# Patient Record
Sex: Male | Born: 1960 | Race: White | Hispanic: No | Marital: Married | Smoking: Never smoker
Health system: Southern US, Community
[De-identification: ages and names within clinical notes are randomized; demographics above are authoritative.]

## PROBLEM LIST (undated history)

## (undated) DIAGNOSIS — G4730 Sleep apnea, unspecified: Secondary | ICD-10-CM

## (undated) DIAGNOSIS — N189 Chronic kidney disease, unspecified: Secondary | ICD-10-CM

## (undated) DIAGNOSIS — R0600 Dyspnea, unspecified: Secondary | ICD-10-CM

---

## 2018-11-03 ENCOUNTER — Inpatient Hospital Stay (HOSPITAL_COMMUNITY)
Admission: EM | Admit: 2018-11-03 | Discharge: 2018-11-09 | DRG: 673 | Disposition: A | Discharge status: Home / Self Care | Payer: No Typology Code available for payment source | Attending: Internal Medicine | Admitting: Internal Medicine

## 2018-11-03 ENCOUNTER — Emergency Department (HOSPITAL_COMMUNITY): Payer: No Typology Code available for payment source

## 2018-11-03 ENCOUNTER — Encounter (HOSPITAL_COMMUNITY): Payer: Self-pay | Admitting: Emergency Medicine

## 2018-11-03 ENCOUNTER — Other Ambulatory Visit: Payer: Self-pay

## 2018-11-03 DIAGNOSIS — N2581 Secondary hyperparathyroidism of renal origin: Secondary | ICD-10-CM | POA: Diagnosis present

## 2018-11-03 DIAGNOSIS — Z419 Encounter for procedure for purposes other than remedying health state, unspecified: Secondary | ICD-10-CM

## 2018-11-03 DIAGNOSIS — N186 End stage renal disease: Secondary | ICD-10-CM | POA: Diagnosis present

## 2018-11-03 DIAGNOSIS — G8250 Quadriplegia, unspecified: Secondary | ICD-10-CM | POA: Diagnosis present

## 2018-11-03 DIAGNOSIS — Z7682 Awaiting organ transplant status: Secondary | ICD-10-CM | POA: Diagnosis not present

## 2018-11-03 DIAGNOSIS — Z20828 Contact with and (suspected) exposure to other viral communicable diseases: Secondary | ICD-10-CM | POA: Diagnosis present

## 2018-11-03 DIAGNOSIS — N319 Neuromuscular dysfunction of bladder, unspecified: Secondary | ICD-10-CM | POA: Diagnosis present

## 2018-11-03 DIAGNOSIS — E785 Hyperlipidemia, unspecified: Secondary | ICD-10-CM | POA: Diagnosis present

## 2018-11-03 DIAGNOSIS — G4730 Sleep apnea, unspecified: Secondary | ICD-10-CM | POA: Diagnosis present

## 2018-11-03 DIAGNOSIS — Z992 Dependence on renal dialysis: Secondary | ICD-10-CM | POA: Diagnosis not present

## 2018-11-03 DIAGNOSIS — D509 Iron deficiency anemia, unspecified: Secondary | ICD-10-CM | POA: Diagnosis present

## 2018-11-03 DIAGNOSIS — E872 Acidosis: Secondary | ICD-10-CM | POA: Diagnosis present

## 2018-11-03 DIAGNOSIS — I120 Hypertensive chronic kidney disease with stage 5 chronic kidney disease or end stage renal disease: Secondary | ICD-10-CM | POA: Diagnosis present

## 2018-11-03 DIAGNOSIS — D649 Anemia, unspecified: Secondary | ICD-10-CM | POA: Diagnosis not present

## 2018-11-03 DIAGNOSIS — N179 Acute kidney failure, unspecified: Secondary | ICD-10-CM | POA: Diagnosis present

## 2018-11-03 DIAGNOSIS — E878 Other disorders of electrolyte and fluid balance, not elsewhere classified: Secondary | ICD-10-CM | POA: Diagnosis not present

## 2018-11-03 DIAGNOSIS — Z79899 Other long term (current) drug therapy: Secondary | ICD-10-CM | POA: Diagnosis not present

## 2018-11-03 HISTORY — DX: Chronic kidney disease, unspecified: N18.9

## 2018-11-03 HISTORY — DX: Dyspnea, unspecified: R06.00

## 2018-11-03 HISTORY — DX: Sleep apnea, unspecified: G47.30

## 2018-11-03 LAB — CBC
HCT: 26.8 % — ABNORMAL LOW (ref 39.0–52.0)
Hemoglobin: 9 g/dL — ABNORMAL LOW (ref 13.0–17.0)
MCH: 32.4 pg (ref 26.0–34.0)
MCHC: 33.6 g/dL (ref 30.0–36.0)
MCV: 96.4 fL (ref 80.0–100.0)
Platelets: 198 10*3/uL (ref 150–400)
RBC: 2.78 MIL/uL — ABNORMAL LOW (ref 4.22–5.81)
RDW: 13.5 % (ref 11.5–15.5)
WBC: 8.7 10*3/uL (ref 4.0–10.5)
nRBC: 0 % (ref 0.0–0.2)

## 2018-11-03 LAB — IRON AND TIBC
Iron: 56 ug/dL (ref 45–182)
Saturation Ratios: 14 % — ABNORMAL LOW (ref 17.9–39.5)
TIBC: 403 ug/dL (ref 250–450)
UIBC: 347 ug/dL

## 2018-11-03 LAB — BASIC METABOLIC PANEL
Anion gap: 18 — ABNORMAL HIGH (ref 5–15)
BUN: 128 mg/dL — ABNORMAL HIGH (ref 6–20)
CO2: 14 mmol/L — ABNORMAL LOW (ref 22–32)
Calcium: 9.1 mg/dL (ref 8.9–10.3)
Chloride: 108 mmol/L (ref 98–111)
Creatinine, Ser: 8.44 mg/dL — ABNORMAL HIGH (ref 0.61–1.24)
GFR calc Af Amer: 7 mL/min — ABNORMAL LOW (ref >60)
GFR calc non Af Amer: 6 mL/min — ABNORMAL LOW (ref >60)
Glucose, Bld: 110 mg/dL — ABNORMAL HIGH (ref 70–99)
Potassium: 4.6 mmol/L (ref 3.5–5.1)
Sodium: 140 mmol/L (ref 135–145)

## 2018-11-03 LAB — SARS CORONAVIRUS 2 (TAT 6-24 HRS): SARS Coronavirus 2: NEGATIVE

## 2018-11-03 MED ORDER — SODIUM CHLORIDE 0.9% FLUSH
3.0000 mL | Freq: Once | INTRAVENOUS | Status: AC
  Administered 2018-11-04: 3 mL via INTRAVENOUS

## 2018-11-03 MED ORDER — DIAZEPAM 2 MG PO TABS: 2.0000 mg | ORAL_TABLET | Freq: Every evening | ORAL | Status: DC | PRN

## 2018-11-03 MED ORDER — TRAZODONE HCL 100 MG PO TABS
100.0000 mg | ORAL_TABLET | Freq: Every day | ORAL | Status: DC
  Administered 2018-11-03 – 2018-11-08 (×6): 100 mg via ORAL
  Filled 2018-11-03 (×6): qty 1

## 2018-11-03 MED ORDER — ACETAMINOPHEN 650 MG RE SUPP: 650.0000 mg | Freq: Four times a day (QID) | RECTAL | Status: DC | PRN

## 2018-11-03 MED ORDER — AMLODIPINE BESYLATE 10 MG PO TABS
10.0000 mg | ORAL_TABLET | Freq: Every day | ORAL | Status: DC
  Administered 2018-11-04 – 2018-11-08 (×4): 10 mg via ORAL
  Filled 2018-11-03 (×5): qty 1

## 2018-11-03 MED ORDER — ONDANSETRON HCL 4 MG PO TABS: 4.0000 mg | ORAL_TABLET | Freq: Four times a day (QID) | ORAL | Status: DC | PRN

## 2018-11-03 MED ORDER — ONDANSETRON HCL 4 MG/2ML IJ SOLN
4.0000 mg | Freq: Four times a day (QID) | INTRAMUSCULAR | Status: DC | PRN
  Administered 2018-11-04: 4 mg via INTRAVENOUS
  Filled 2018-11-03: qty 2

## 2018-11-03 MED ORDER — ACETAMINOPHEN 325 MG PO TABS
650.0000 mg | ORAL_TABLET | Freq: Four times a day (QID) | ORAL | Status: DC | PRN
  Administered 2018-11-05: 650 mg via ORAL
  Filled 2018-11-03: qty 2

## 2018-11-03 MED ORDER — ATORVASTATIN CALCIUM 10 MG PO TABS
10.0000 mg | ORAL_TABLET | Freq: Every day | ORAL | Status: DC
  Administered 2018-11-03 – 2018-11-09 (×7): 10 mg via ORAL
  Filled 2018-11-03 (×7): qty 1

## 2018-11-03 MED ORDER — VITAMIN D 25 MCG (1000 UNIT) PO TABS
1000.0000 [IU] | ORAL_TABLET | Freq: Every day | ORAL | Status: DC
  Administered 2018-11-04 – 2018-11-09 (×6): 1000 [IU] via ORAL
  Filled 2018-11-03 (×6): qty 1

## 2018-11-03 MED ORDER — SODIUM BICARBONATE 650 MG PO TABS
1300.0000 mg | ORAL_TABLET | Freq: Three times a day (TID) | ORAL | Status: DC
  Administered 2018-11-03 – 2018-11-04 (×2): 1300 mg via ORAL
  Filled 2018-11-03 (×2): qty 2

## 2018-11-03 MED ORDER — HEPARIN SODIUM (PORCINE) 5000 UNIT/ML IJ SOLN
5000.0000 [IU] | Freq: Three times a day (TID) | INTRAMUSCULAR | Status: DC
  Administered 2018-11-03 – 2018-11-09 (×16): 5000 [IU] via SUBCUTANEOUS
  Filled 2018-11-03 (×17): qty 1

## 2018-11-03 MED ORDER — SEVELAMER CARBONATE 800 MG PO TABS
800.0000 mg | ORAL_TABLET | Freq: Three times a day (TID) | ORAL | Status: DC
  Administered 2018-11-04 (×2): 800 mg via ORAL
  Filled 2018-11-03 (×2): qty 1

## 2018-11-03 NOTE — ED Notes (Addendum)
ED TO INPATIENT HANDOFF REPORT  ED Nurse Name and Phone #: William Hamburger, RN [phone]  S Name/Age/Gender Jeremiah Page 58 y.o. male Room/Bed: 041C/041C  Code Status   Code Status: Not on file  Home/SNF/Other Home Patient oriented to: situation Is this baseline? No   Triage Complete: Triage complete  Chief Complaint sick  Triage Note Pt arrives from [place] clinic where he was sent here to "get started with PD" pt had his access placed recently but has not started PD as of yet . Pot is also of the transplant list for a kidney due to kidney failure.    Allergies No Known Allergies  Level of Care/Admitting Diagnosis ED Disposition    ED Disposition Condition Comment   Admit  The patient appears reasonably stabilized for admission considering the current resources, flow, and capabilities available in the ED at this time, and I doubt any other [facility] requiring further screening and/or treatment in the ED prior to admission is  present.       B Medical/Surgery History History reviewed. No pertinent past medical history. History reviewed. No pertinent surgical history.   A IV Location/Drains/Wounds Patient Lines/Drains/Airways Status   Active Line/Drains/Airways    Name:   Placement date:   Placement time:   Site:   Days:   Peripheral IV 11/03/18 Right Antecubital   11/03/18    1600    Antecubital   less than 1          Intake/Output Last 24 hours  Intake/Output Summary (Last 24 hours) at 11/03/2018 1709 Last data filed at 11/03/2018 1542 Gross per 24 hour  Intake 0 ml  Output 0 ml  Net 0 ml    Labs/Imaging Results for orders placed or performed during the hospital encounter of 11/03/18 (from the past 48 hour(s))  Basic metabolic panel     Status: Abnormal   Collection Time: 11/03/18 10:55 AM  Result Value Ref Range   Sodium 140 135 - 145 mmol/L   Potassium 4.6 3.5 - 5.1 mmol/L   Chloride 108 98 - 111 mmol/L   CO2 14 (L) 22 - 32 mmol/L   Glucose, Bld 110 (H) 70 - 99  mg/dL   BUN 128 (H) 6 - 20 mg/dL   Creatinine, Ser 8.44 (H) 0.61 - 1.24 mg/dL   Calcium 9.1 8.9 - 10.3 mg/dL   GFR calc non Af Amer 6 (L) >60 mL/min   GFR calc Af Amer 7 (L) >60 mL/min   Anion gap 18 (H) 5 - 15    Comment: Performed at [facility], [address] [place], [place] [address]  CBC     Status: Abnormal   Collection Time: 11/03/18 10:55 AM  Result Value Ref Range   WBC 8.7 4.0 - 10.5 K/uL   RBC 2.78 (L) 4.22 - 5.81 MIL/uL   Hemoglobin 9.0 (L) 13.0 - 17.0 g/dL   HCT 26.8 (L) 39.0 - 52.0 %   MCV 96.4 80.0 - 100.0 fL   MCH 32.4 26.0 - 34.0 pg   MCHC 33.6 30.0 - 36.0 g/dL   RDW 13.5 11.5 - 15.5 %   Platelets 198 150 - 400 K/uL   nRBC 0.0 0.0 - 0.2 %    Comment: Performed at [facility], [place] [address], [place], [place] [address]   No results found.  Pending Labs Unresulted Labs (From admission, onward)    Start     Ordered   11/03/18 1704  Parathyroid hormone, intact (  no Ca)  Once,   STAT     11/03/18 1703   11/03/18 1704  Iron and TIBC  Once,   STAT     11/03/18 1703   11/03/18 1550  SARS CORONAVIRUS 2 (TAT 6-12 HRS) Nasal Swab Aptima Multi Swab  (COVID Labs)  Once,   STAT    Question Answer Comment  Is this test for diagnosis or screening Screening   Symptomatic for COVID-19 as defined by CDC No   Hospitalized for COVID-19 No   Admitted to ICU for COVID-19 No   Previously tested for COVID-19 No   Resident in a congregate (group) care setting No   Employed in healthcare setting No   Pre-procedural testing Yes      11/03/18 1549          Vitals/Pain Today's Vitals   11/03/18 1539 11/03/18 1542 11/03/18 1600 11/03/18 1630  BP: (!) (P) 136/94 (!) 136/94 (!) 140/100 (!) 146/97  Pulse: (P) 75 71 68 73  Resp:  18 14 16   Temp:      TempSrc:      SpO2:  100% 100% 100%  Weight:      Height:      PainSc:        Isolation Precautions No active isolations  Medications Medications  sodium chloride flush (NS) 0.9 % injection 3 mL (has no  administration in time range)    Mobility manual wheelchair Moderate fall risk   Focused Assessments Cardiac Assessment Handoff:  Cardiac Rhythm: Normal sinus rhythm No results found for: CKTOTAL, CKMB, CKMBINDEX, TROPONINI No results found for: DDIMER Does the Patient currently have chest pain? No   , Renal Assessment Handoff:  Hemodialysis Schedule: none yet.  Last Hemodialysis date and time: New** peritoneal dialysis.    Restricted appendage:  none; see above.   , Pulmonary Assessment Handoff:  Lung sounds:   O2 Device: Room Air        R Recommendations: See Admitting Provider Note  Report given to:   Additional Notes:
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
## 2018-11-03 NOTE — ED Triage Notes (Signed)
Pt arrives from [place] clinic where he was sent here to "get started with PD" pt had his access placed recently but has not started PD as of yet . Pot is also of the transplant list for a kidney due to kidney failure.

## 2018-11-03 NOTE — ED Provider Notes (Signed)
[place] [facility] EMERGENCY DEPARTMENT Provider Note   CSN: [id number] Arrival date & time: 11/03/18  1036     History   Chief Complaint Chief Complaint  Patient presents with  . Vascular Access Problem    HPI Jeremiah Page is a 58 y.o. male with history of CKD currently awaiting Page, [facility] Page for his CKD on September 3 of this year.  Patient reports that his symptoms of fatigue and leg twitching have been worsening, he spoke to his nephrologist at the [facility] who in turn spoke to nephrologist Dr. Joelyn Oms, plan was to bring patient to [facility] to begin peritoneal Page.  Patient denies fever/chills, headache, chest pain, abdominal pain, nausea/vomiting or any additional concerns today.  Unable to access records through EMR.    HPI  History reviewed. No pertinent past medical history.  There are no active problems to display for this patient.   History reviewed. No pertinent surgical history.      Home Medications    Prior to Admission medications   Not on File    Family History No family history on file.  Social History Social History   Tobacco Use  . Smoking status: Not on file  Substance Use Topics  . Alcohol use: Not on file  . Drug use: Not on file     Allergies   Patient has no known allergies.   Review of Systems Review of Systems Ten systems are reviewed and are negative for acute change except as noted in the HPI  Physical Exam Updated Vital Signs BP (!) 136/94 (BP Location: Right Arm)   Pulse 71   Temp (!) 97.5 F (36.4 C) (Oral)   Resp 18   Ht 5\' 11"  (1.803 m)   Wt 83.9 kg   SpO2 100%   BMI 25.80 kg/m   Physical Exam Constitutional:      General: He is not in acute distress.    Appearance: Normal appearance. He is well-developed. He is not ill-appearing or diaphoretic.  HENT:     Head:  Normocephalic and atraumatic.     Right Ear: External ear normal.     Left Ear: External ear normal.     Nose: Nose normal.  Eyes:     General: Vision grossly intact. Gaze aligned appropriately.     Pupils: Pupils are equal, round, and reactive to light.  Neck:     Musculoskeletal: Normal range of motion.     Trachea: Trachea and phonation normal. No tracheal deviation.  Pulmonary:     Effort: Pulmonary effort is normal. No respiratory distress.  Abdominal:     General: There is no distension.     Palpations: Abdomen is soft.     Tenderness: There is no abdominal tenderness. There is no guarding or rebound.  Musculoskeletal: Normal range of motion.  Skin:    General: Skin is warm and dry.  Neurological:     Mental Status: He is alert.     GCS: GCS eye subscore is 4. GCS verbal subscore is 5. GCS motor subscore is 6.     Comments: Speech is clear and goal oriented, follows commands Major Cranial nerves without deficit, no facial droop Moves extremities without ataxia, coordination intact Occasional twitch of right lower extremity.  Psychiatric:        Behavior: Behavior normal.    ED Treatments / Results  Labs (all labs  ordered are listed, but only abnormal results are displayed) Labs Reviewed  BASIC METABOLIC PANEL - Abnormal; Notable for the following components:      Result Value   CO2 14 (*)    Glucose, Bld 110 (*)    BUN 128 (*)    Creatinine, Ser 8.44 (*)    GFR calc non Af Amer 6 (*)    GFR calc Af Amer 7 (*)    Anion gap 18 (*)    All other components within normal limits  CBC - Abnormal; Notable for the following components:   RBC 2.78 (*)    Hemoglobin 9.0 (*)    HCT 26.8 (*)    All other components within normal limits    EKG None  Radiology No results found.  Procedures Procedures (including critical care time)  Medications Ordered in ED Medications  sodium chloride flush (NS) 0.9 % injection 3 mL (has no administration in time range)      Initial Impression / Assessment and Plan / ED Course  I have reviewed the triage vital signs and the nursing notes.  Pertinent labs & imaging results that were available during my care of the patient were reviewed by me and considered in my medical decision making (see chart for details).  Clinical Course as of Nov 03 1634  Tue Nov 03, 2018  1537 Dr. Justin Mend Admit to internal medicine to start PD, nephrology to consult.   [BM]  [id number] Internal Medicine Service   [BM]    Clinical Course User Index [BM] Nuala Alpha A, PA-C   CBC with hemoglobin of 9.0, no prior to compare BMP with creatinine 8.44, bicarb 14, anion gap 18, BUN 128, no prior to compare however would be consistent with patient's known kidney disease - Afebrile, not tachycardic, not hypotensive, no hypoxia on room air. - Discussed case with on-call nephrologist Dr. Justin Mend who is aware of the patient, he advises admission at this time for patient to be started on peritoneal Page, nephrology to consult in the hospital. - Patient reassessed and resting comfortably no acute distress.  He is agreeable to admission at this time.  He has no further questions or concerns. - Discussed case with internal medicine teaching service, they will see patient for admission. - Patient seen and evaluated by nephrologist Dr. Justin Mend, rediscussed case, chest x-ray added, patient to be admitted. - CXR:    IMPRESSION:  No acute cardiopulmonary findings.  - Patient has been admitted to internal medicine service for further evaluation and management.  Patient's case discussed with Dr. Eulis Foster during this visit.  Note: Portions of this report may have been transcribed using voice recognition software. Every effort was made to ensure accuracy; however, inadvertent computerized transcription errors may still be present. Final Clinical Impressions(s) / ED Diagnoses   Final diagnoses:  End-stage renal disease needing Page [facility])    ED  Discharge Orders    None       Gari Crown 11/03/18 1732    Daleen Bo, MD 11/05/18 1730
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
## 2018-11-04 DIAGNOSIS — E878 Other disorders of electrolyte and fluid balance, not elsewhere classified: Secondary | ICD-10-CM

## 2018-11-04 DIAGNOSIS — Z79899 Other long term (current) drug therapy: Secondary | ICD-10-CM

## 2018-11-04 DIAGNOSIS — N186 End stage renal disease: Secondary | ICD-10-CM

## 2018-11-04 DIAGNOSIS — E785 Hyperlipidemia, unspecified: Secondary | ICD-10-CM

## 2018-11-04 DIAGNOSIS — Z992 Dependence on renal dialysis: Secondary | ICD-10-CM

## 2018-11-04 DIAGNOSIS — E872 Acidosis: Secondary | ICD-10-CM

## 2018-11-04 LAB — RENAL FUNCTION PANEL
Albumin: 3.2 g/dL — ABNORMAL LOW (ref 3.5–5.0)
Anion gap: 14 (ref 5–15)
BUN: 130 mg/dL — ABNORMAL HIGH (ref 6–20)
CO2: 12 mmol/L — ABNORMAL LOW (ref 22–32)
Calcium: 8.8 mg/dL — ABNORMAL LOW (ref 8.9–10.3)
Chloride: 113 mmol/L — ABNORMAL HIGH (ref 98–111)
Creatinine, Ser: 8.95 mg/dL — ABNORMAL HIGH (ref 0.61–1.24)
GFR calc Af Amer: 7 mL/min — ABNORMAL LOW (ref >60)
GFR calc non Af Amer: 6 mL/min — ABNORMAL LOW (ref >60)
Glucose, Bld: 98 mg/dL (ref 70–99)
Phosphorus: 9.6 mg/dL — ABNORMAL HIGH (ref 2.5–4.6)
Potassium: 4.7 mmol/L (ref 3.5–5.1)
Sodium: 139 mmol/L (ref 135–145)

## 2018-11-04 LAB — HIV ANTIBODY (ROUTINE TESTING W REFLEX): HIV Screen 4th Generation wRfx: NONREACTIVE

## 2018-11-04 LAB — MRSA PCR SCREENING: MRSA by PCR: NEGATIVE

## 2018-11-04 MED ORDER — SODIUM CHLORIDE 0.9 % IV SOLN
1.5000 g | INTRAVENOUS | Status: DC
  Filled 2018-11-04: qty 1.5

## 2018-11-04 MED ORDER — SODIUM CHLORIDE 0.9 % IV SOLN
125.0000 mg | Freq: Every day | INTRAVENOUS | Status: AC
  Administered 2018-11-04 – 2018-11-07 (×4): 125 mg via INTRAVENOUS
  Filled 2018-11-04 (×5): qty 10

## 2018-11-04 MED ORDER — SODIUM BICARBONATE 650 MG PO TABS
1950.0000 mg | ORAL_TABLET | Freq: Three times a day (TID) | ORAL | Status: DC
  Administered 2018-11-04 – 2018-11-09 (×12): 1950 mg via ORAL
  Filled 2018-11-04 (×13): qty 3

## 2018-11-04 NOTE — Progress Notes (Signed)
New Admission Note:   Arrival Method: stretcher Mental Orientation: axox4 Telemetry: # 20 NSR Assessment: Completed Skin:intact IV: RT AC NSL Pain: none Tubes: PD Catheter Safety Measures: Safety Fall Prevention Plan has been discussed Admission: Completed [facility] Orientation: Patient has been orientated to the room, unit and staff.  Family:at bedside  Orders have been reviewed and implemented. Will continue to monitor the patient. Call light has been placed within reach and bed alarm has been activated.   Rockie Neighbours BSN, RN Phone number: [phone] Admission Note:

## 2018-11-04 NOTE — Progress Notes (Signed)
Renal Navigator was notified by Dr. Webb/Nephrologist that patient needs OP HD referral initiated. Renal Navigator spoke with patient who requests that Navigator speak with his wife regarding this matter.  Renal Navigator called patient's wife Jeremiah Page/[phone] to complete assessment. Referral for OP HD submitted to [facility].  Renal Navigator will follow closely.  Alphonzo Cruise, [place] Renal Navigator [phone]
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
## 2018-11-04 NOTE — Progress Notes (Signed)
   Subjective:   Mr. Skillin endorses continued intermittent SOB, without any alleviating or exacerbating factors, in addition to increased fatigue. He notes nausea has resolved.   He is aware of plan to go ahead with HD while admitted. He is in agreement and is looking forward to feeling better. All questions and concerns addressed.   Objective:  Vital signs in last 24 hours: Vitals:   11/03/18 1800 11/03/18 1830 11/03/18 1956 11/04/18 0349  BP: (!) 147/92 (!) 148/93 (!) 146/88 123/77  Pulse: 76 72 80 78  Resp: 14 15 18 16   Temp:   98.3 F (36.8 C) 97.6 F (36.4 C)  TempSrc:   Oral Oral  SpO2: 100% 100% 98% 98%  Weight:    84.8 kg  Height:       Physical Exam Vitals signs and nursing note reviewed.  Constitutional:      General: He is not in acute distress.    Appearance: He is normal weight.  Cardiovascular:     Rate and Rhythm: Normal rate and regular rhythm.     Heart sounds: No murmur.  Pulmonary:     Effort: Pulmonary effort is normal. No respiratory distress.     Breath sounds: Normal breath sounds. No wheezing or rales.  Abdominal:     Palpations: Abdomen is soft.  Skin:    General: Skin is warm and dry.  Neurological:     General: No focal deficit present.     Mental Status: He is alert.     Comments: No asterixis.     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   # End Stage Renal Disease # Uremia # Hyperchloremic Metabolic Acidosis Patient continues to present with uremic symptoms but seems likely improved compared to yesterday. Plan to initiate HD after IR placements catheter; placement scheduled for tomorrow (8/27).  - Nephrology is on board and we appreciate their recommendations  - IR placement of catheter tomorrow  # Normocytic Anemia: Iron levels are  WNL, however saturation is slightly decreased. TIBC is also WNL.   - Iron supplementation per Nephrology - EPO treatment per Nephrology  # Hypertension:  - Amlodipine 10mg    # Hyperlipidemia:  - Atorvastatin 10mg     Dispo: Anticipated discharge pending medical improvement.   Dr. Jose Persia Internal Medicine PGY-1  Pager: [phone] 11/04/2018, 7:10 AM

## 2018-11-04 NOTE — Progress Notes (Signed)
[facility] ROUNDING NOTE   Subjective:   This is a very pleasant 58 year old gentleman followed in [place] and also by nephrologist in [place].  He had a peritoneal dialysis catheter placed in June 2020 he did not wish to start dialysis immediately as he was hoping to receive a renal transplant.  He presented to [facility] 11/03/2018 with weakness fatigue and dyspnea.  His labs revealed a BUN of 128 creatinine 8.44.  His wife was concerned because he become increasingly more somnolent over the weekend.  Blood pressure 126/81 pulse 80 temperature 97.6 O2 sats 99% room air  Sodium 139 potassium 4.7 chloride 113 CO2 12 BUN 130 creatinine 8.95 phosphorus 3.2 calcium 9.6 iron saturations 14% WBC 8.7 hemoglobin 9.0 platelets 198  Amlodipine 10 mg daily Lipitor 10 mg daily vitamin D 1000 units daily Renvela 800 mg with meals sodium bicarbonate 1.3 g 3 times daily trazodone 100 mg nightly   Objective:  Vital signs in last 24 hours:  Temp:  [97.5 F (36.4 C)-98.3 F (36.8 C)] 97.6 F (36.4 C) (08/26 0349) Pulse Rate:  [68-80] 80 (08/26 0946) Resp:  [8-18] 18 (08/26 0946) BP: (123-148)/(77-100) 126/81 (08/26 0946) SpO2:  [97 %-100 %] 99 % (08/26 0946) Weight:  [84.8 kg] 84.8 kg (08/26 0349)  Weight change:  Filed Weights   11/03/18 1040 11/04/18 0349  Weight: 83.9 kg 84.8 kg    Intake/Output: I/O last 3 completed shifts: In: 480 [P.O.:480] Out: 1500 [Urine:1500]   Intake/Output this shift:  No intake/output data recorded. Nondistressed alert and oriented x3 CVS- RRR no murmurs rubs gallops RS- CTA no wheezes rales ABD- BS present soft non-distended PD catheter in place site dry clean EXT- no edema   Basic Metabolic Panel: Recent Labs  Lab 11/03/18 1055 11/04/18 0540  NA 140 139  K 4.6 4.7  CL 108 113*  CO2 14* 12*  GLUCOSE 110* 98  BUN 128* 130*  CREATININE 8.44* 8.95*  CALCIUM 9.1 8.8*  PHOS  --  9.6*    Liver Function  Tests: Recent Labs  Lab 11/04/18 0540  ALBUMIN 3.2*   No results for input(s): LIPASE, AMYLASE in the last 168 hours. No results for input(s): AMMONIA in the last 168 hours.  CBC: Recent Labs  Lab 11/03/18 1055  WBC 8.7  HGB 9.0*  HCT 26.8*  MCV 96.4  PLT 198    Cardiac Enzymes: No results for input(s): CKTOTAL, CKMB, CKMBINDEX, TROPONINI in the last 168 hours.  BNP: Invalid input(s): POCBNP  CBG: No results for input(s): GLUCAP in the last 168 hours.  Microbiology: Results for orders placed or performed during the hospital encounter of 11/03/18  SARS CORONAVIRUS 2 (TAT 6-12 HRS) Nasal Swab Aptima Multi Swab     Status: None   Collection Time: 11/03/18  3:50 PM   Specimen: Aptima Multi Swab; Nasal Swab  Result Value Ref Range Status   SARS Coronavirus 2 NEGATIVE NEGATIVE Final    Comment: (NOTE) SARS-CoV-2 target nucleic acids are NOT DETECTED. The SARS-CoV-2 RNA is generally detectable in upper and lower respiratory specimens during the acute phase of infection. Negative results do not preclude SARS-CoV-2 infection, do not rule out co-infections with other pathogens, and should not be used as the sole basis for treatment or other patient management decisions. Negative results must be combined with clinical observations, patient history, and epidemiological information. The expected result is Negative. Fact Sheet for Patients: SugarRoll.be Fact Sheet for Healthcare Providers: https://www.woods-mathews.com/ This test is not  yet approved or cleared by the [place] and  has been authorized for detection and/or diagnosis of SARS-CoV-2 by FDA under an Emergency Use Authorization (EUA). This EUA will remain  in effect (meaning this test can be used) for the duration of the COVID-19 declaration under Section 56 4(b)(1) of the Act, 21 U.S.C. section 360bbb-3(b)(1), unless the authorization is terminated or revoked  sooner. Performed at [facility], [place] [address] [place], [place] [address]   MRSA PCR Screening     Status: None   Collection Time: 11/04/18  4:28 AM   Specimen: Nasal Mucosa; Nasopharyngeal  Result Value Ref Range Status   MRSA by PCR NEGATIVE NEGATIVE Final    Comment:        The GeneXpert MRSA Assay (FDA approved for NASAL specimens only), is one component of a comprehensive MRSA colonization surveillance program. It is not intended to diagnose MRSA infection nor to guide or monitor treatment for MRSA infections. Performed at [facility], [place] [address] [place], [place] [address]     Coagulation Studies: No results for input(s): LABPROT, INR in the last 72 hours.  Urinalysis: No results for input(s): COLORURINE, LABSPEC, PHURINE, GLUCOSEU, HGBUR, BILIRUBINUR, KETONESUR, PROTEINUR, UROBILINOGEN, NITRITE, LEUKOCYTESUR in the last 72 hours.  Invalid input(s): APPERANCEUR    Imaging: Dg Chest Portable 1 View  Result Date: 11/03/2018 CLINICAL DATA:  Renal failure EXAM: PORTABLE CHEST 1 VIEW COMPARISON:  None. FINDINGS: The heart size and mediastinal contours are within normal limits. Both lungs are clear. The visualized skeletal structures are unremarkable. IMPRESSION: No acute cardiopulmonary findings. Electronically Signed   By: Davina Poke M.D.   On: 11/03/2018 17:12     Medications:   . [START ON 11/05/2018] cefUROXime (ZINACEF)  IV     . amLODipine  10 mg Oral Daily  . atorvastatin  10 mg Oral q1800  . cholecalciferol  1,000 Units Oral Daily  . heparin  5,000 Units Subcutaneous Q8H  . sevelamer carbonate  800 mg Oral TID WC  . sodium bicarbonate  1,300 mg Oral TID  . 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  Presented with uremic signs and symptoms.  Have suggested the patient start  dialysis.  We are unable to provide crash start peritoneal dialysis.  We will therefore place dialysis catheter and place patient on hemodialysis emergently.  Have discussed with vascular they are willing to place a tunneled dialysis catheter 11/05/2018.  We will therefore initiate dialysis treatments after this is occurred.  Metabolic acidosis will increase sodium bicarbonate to 3 tablets 3 times daily  Anemia appears to have some iron deficiency anemia we will start IV iron  Bones will check PTH continue Renvela binders.  Hyperlipidemia continue atorvastatin  Hypertension/volume does not appear to be an issue at this time.   LOS: [place] @TODAY @10 :50 AM

## 2018-11-05 ENCOUNTER — Inpatient Hospital Stay (HOSPITAL_COMMUNITY): Payer: No Typology Code available for payment source

## 2018-11-05 ENCOUNTER — Inpatient Hospital Stay (HOSPITAL_COMMUNITY): Payer: No Typology Code available for payment source | Admitting: Anesthesiology

## 2018-11-05 ENCOUNTER — Encounter (HOSPITAL_COMMUNITY)
Admission: EM | Disposition: A | Discharge status: Home / Self Care | Payer: Self-pay | Source: Home / Self Care | Attending: Internal Medicine

## 2018-11-05 ENCOUNTER — Encounter (HOSPITAL_COMMUNITY): Payer: Self-pay | Admitting: Anesthesiology

## 2018-11-05 HISTORY — PX: INSERTION OF DIALYSIS CATHETER: SHX1324

## 2018-11-05 LAB — CBC WITH DIFFERENTIAL/PLATELET
Abs Immature Granulocytes: 0.03 10*3/uL (ref 0.00–0.07)
Basophils Absolute: 0 10*3/uL (ref 0.0–0.1)
Basophils Relative: 1 %
Eosinophils Absolute: 0.2 10*3/uL (ref 0.0–0.5)
Eosinophils Relative: 2 %
HCT: 26.6 % — ABNORMAL LOW (ref 39.0–52.0)
Hemoglobin: 8.7 g/dL — ABNORMAL LOW (ref 13.0–17.0)
Immature Granulocytes: 1 %
Lymphocytes Relative: 14 %
Lymphs Abs: 0.9 10*3/uL (ref 0.7–4.0)
MCH: 31.8 pg (ref 26.0–34.0)
MCHC: 32.7 g/dL (ref 30.0–36.0)
MCV: 97.1 fL (ref 80.0–100.0)
Monocytes Absolute: 0.5 10*3/uL (ref 0.1–1.0)
Monocytes Relative: 8 %
Neutro Abs: 4.8 10*3/uL (ref 1.7–7.7)
Neutrophils Relative %: 74 %
Platelets: 190 10*3/uL (ref 150–400)
RBC: 2.74 MIL/uL — ABNORMAL LOW (ref 4.22–5.81)
RDW: 13.4 % (ref 11.5–15.5)
WBC: 6.3 10*3/uL (ref 4.0–10.5)
nRBC: 0 % (ref 0.0–0.2)

## 2018-11-05 LAB — RENAL FUNCTION PANEL
Albumin: 2.9 g/dL — ABNORMAL LOW (ref 3.5–5.0)
Anion gap: 14 (ref 5–15)
BUN: 120 mg/dL — ABNORMAL HIGH (ref 6–20)
CO2: 14 mmol/L — ABNORMAL LOW (ref 22–32)
Calcium: 8.6 mg/dL — ABNORMAL LOW (ref 8.9–10.3)
Chloride: 113 mmol/L — ABNORMAL HIGH (ref 98–111)
Creatinine, Ser: 8.75 mg/dL — ABNORMAL HIGH (ref 0.61–1.24)
GFR calc Af Amer: 7 mL/min — ABNORMAL LOW (ref >60)
GFR calc non Af Amer: 6 mL/min — ABNORMAL LOW (ref >60)
Glucose, Bld: 110 mg/dL — ABNORMAL HIGH (ref 70–99)
Phosphorus: 8.5 mg/dL — ABNORMAL HIGH (ref 2.5–4.6)
Potassium: 4.5 mmol/L (ref 3.5–5.1)
Sodium: 141 mmol/L (ref 135–145)

## 2018-11-05 LAB — PARATHYROID HORMONE, INTACT (NO CA): PTH: 76 pg/mL — ABNORMAL HIGH (ref 15–65)

## 2018-11-05 LAB — SURGICAL PCR SCREEN
MRSA, PCR: NEGATIVE
Staphylococcus aureus: NEGATIVE

## 2018-11-05 SURGERY — INSERTION OF DIALYSIS CATHETER
Anesthesia: Monitor Anesthesia Care | Site: Neck | Laterality: Right

## 2018-11-05 MED ORDER — SODIUM CHLORIDE 0.9 % IV SOLN
1.5000 g | INTRAVENOUS | Status: AC
  Administered 2018-11-05: 14:00:00 1.5 g via INTRAVENOUS
  Filled 2018-11-05: qty 1.5

## 2018-11-05 MED ORDER — ONDANSETRON HCL 4 MG/2ML IJ SOLN
INTRAMUSCULAR | Status: AC
  Filled 2018-11-05: qty 2

## 2018-11-05 MED ORDER — CHLORHEXIDINE GLUCONATE CLOTH 2 % EX PADS: 6.0000 | MEDICATED_PAD | Freq: Every day | CUTANEOUS | Status: DC

## 2018-11-05 MED ORDER — HYDROCODONE-ACETAMINOPHEN 5-325 MG PO TABS: 1.0000 | ORAL_TABLET | ORAL | Status: DC | PRN

## 2018-11-05 MED ORDER — MEPERIDINE HCL 25 MG/ML IJ SOLN: 6.2500 mg | INTRAMUSCULAR | Status: DC | PRN

## 2018-11-05 MED ORDER — ONDANSETRON HCL 4 MG/2ML IJ SOLN
INTRAMUSCULAR | Status: DC | PRN
  Administered 2018-11-05: 4 mg via INTRAVENOUS

## 2018-11-05 MED ORDER — LIDOCAINE HCL (PF) 1 % IJ SOLN
INTRAMUSCULAR | Status: DC | PRN
  Administered 2018-11-05: 9 mL

## 2018-11-05 MED ORDER — HEPARIN SODIUM (PORCINE) 1000 UNIT/ML IJ SOLN
INTRAMUSCULAR | Status: DC | PRN
  Administered 2018-11-05: 3000 [IU]

## 2018-11-05 MED ORDER — MIDAZOLAM HCL 5 MG/5ML IJ SOLN
INTRAMUSCULAR | Status: DC | PRN
  Administered 2018-11-05: 2 mg via INTRAVENOUS

## 2018-11-05 MED ORDER — 0.9 % SODIUM CHLORIDE (POUR BTL) OPTIME
TOPICAL | Status: DC | PRN
  Administered 2018-11-05: 14:00:00 1000 mL

## 2018-11-05 MED ORDER — FENTANYL CITRATE (PF) 100 MCG/2ML IJ SOLN
INTRAMUSCULAR | Status: DC | PRN
  Administered 2018-11-05: 50 ug via INTRAVENOUS

## 2018-11-05 MED ORDER — CHLORHEXIDINE GLUCONATE CLOTH 2 % EX PADS
6.0000 | MEDICATED_PAD | Freq: Every day | CUTANEOUS | Status: DC
  Administered 2018-11-06 – 2018-11-08 (×3): 6 via TOPICAL

## 2018-11-05 MED ORDER — FENTANYL CITRATE (PF) 250 MCG/5ML IJ SOLN
INTRAMUSCULAR | Status: AC
  Filled 2018-11-05: qty 5

## 2018-11-05 MED ORDER — MIDAZOLAM HCL 2 MG/2ML IJ SOLN: 0.5000 mg | Freq: Once | INTRAMUSCULAR | Status: DC | PRN

## 2018-11-05 MED ORDER — PROMETHAZINE HCL 25 MG/ML IJ SOLN: 6.2500 mg | INTRAMUSCULAR | Status: DC | PRN

## 2018-11-05 MED ORDER — MIDAZOLAM HCL 2 MG/2ML IJ SOLN
INTRAMUSCULAR | Status: AC
  Filled 2018-11-05: qty 2

## 2018-11-05 MED ORDER — FENTANYL CITRATE (PF) 100 MCG/2ML IJ SOLN: 25.0000 ug | INTRAMUSCULAR | Status: DC | PRN

## 2018-11-05 MED ORDER — HEPARIN SODIUM (PORCINE) 1000 UNIT/ML IJ SOLN
INTRAMUSCULAR | Status: AC
  Filled 2018-11-05: qty 1

## 2018-11-05 MED ORDER — SODIUM CHLORIDE 0.9 % IV SOLN
INTRAVENOUS | Status: DC
  Administered 2018-11-05: 13:00:00 via INTRAVENOUS

## 2018-11-05 MED ORDER — LIDOCAINE HCL (PF) 1 % IJ SOLN
INTRAMUSCULAR | Status: AC
  Filled 2018-11-05: qty 30

## 2018-11-05 MED ORDER — SODIUM CHLORIDE 0.9 % IV SOLN
INTRAVENOUS | Status: AC
  Filled 2018-11-05: qty 1.2

## 2018-11-05 MED ORDER — PROPOFOL 500 MG/50ML IV EMUL
INTRAVENOUS | Status: DC | PRN
  Administered 2018-11-05: 75 ug/kg/min via INTRAVENOUS

## 2018-11-05 MED ORDER — SEVELAMER CARBONATE 800 MG PO TABS
1600.0000 mg | ORAL_TABLET | Freq: Three times a day (TID) | ORAL | Status: DC
  Administered 2018-11-05 – 2018-11-09 (×12): 1600 mg via ORAL
  Filled 2018-11-05 (×13): qty 2

## 2018-11-05 MED ORDER — SODIUM CHLORIDE 0.9 % IV SOLN
INTRAVENOUS | Status: DC | PRN
  Administered 2018-11-05: 14:00:00 500 mL

## 2018-11-05 SURGICAL SUPPLY — 39 items
BAG DECANTER FOR FLEXI CONT (MISCELLANEOUS) ×3 IMPLANT
BIOPATCH RED 1 DISK 7.0 (GAUZE/BANDAGES/DRESSINGS) ×2 IMPLANT
BIOPATCH RED 1IN DISK 7.0MM (GAUZE/BANDAGES/DRESSINGS) ×1
CATH PALINDROME RT-P 15FX19CM (CATHETERS) ×2 IMPLANT
CATH PALINDROME RT-P 15FX23CM (CATHETERS) IMPLANT
CATH PALINDROME RT-P 15FX28CM (CATHETERS) IMPLANT
CATH PALINDROME RT-P 15FX55CM (CATHETERS) IMPLANT
COVER PROBE W GEL 5X96 (DRAPES) ×3 IMPLANT
COVER SURGICAL LIGHT HANDLE (MISCELLANEOUS) ×3 IMPLANT
COVER WAND RF STERILE (DRAPES) ×3 IMPLANT
DERMABOND ADVANCED (GAUZE/BANDAGES/DRESSINGS) ×2
DERMABOND ADVANCED .7 DNX12 (GAUZE/BANDAGES/DRESSINGS) ×1 IMPLANT
DRAPE C-ARM 42X72 X-RAY (DRAPES) ×3 IMPLANT
DRAPE CHEST BREAST 15X10 FENES (DRAPES) ×3 IMPLANT
GAUZE 4X4 16PLY RFD (DISPOSABLE) ×3 IMPLANT
GLOVE BIO SURGEON STRL SZ7.5 (GLOVE) ×3 IMPLANT
GOWN STRL REUS W/ TWL LRG LVL3 (GOWN DISPOSABLE) ×1 IMPLANT
GOWN STRL REUS W/ TWL XL LVL3 (GOWN DISPOSABLE) ×1 IMPLANT
GOWN STRL REUS W/TWL LRG LVL3 (GOWN DISPOSABLE) ×2
GOWN STRL REUS W/TWL XL LVL3 (GOWN DISPOSABLE) ×2
KIT BASIN OR (CUSTOM PROCEDURE TRAY) ×3 IMPLANT
KIT TURNOVER KIT B (KITS) ×3 IMPLANT
NDL 18GX1X1/2 (RX/OR ONLY) (NEEDLE) ×1 IMPLANT
NDL HYPO 25GX1X1/2 BEV (NEEDLE) ×1 IMPLANT
NEEDLE 18GX1X1/2 (RX/OR ONLY) (NEEDLE) ×3 IMPLANT
NEEDLE HYPO 25GX1X1/2 BEV (NEEDLE) ×3 IMPLANT
NS IRRIG 1000ML POUR BTL (IV SOLUTION) ×3 IMPLANT
PACK SURGICAL SETUP 50X90 (CUSTOM PROCEDURE TRAY) ×3 IMPLANT
PAD ARMBOARD 7.5X6 YLW CONV (MISCELLANEOUS) ×6 IMPLANT
SOAP 2 % CHG 4 OZ (WOUND CARE) ×3 IMPLANT
SUT ETHILON 3 0 PS 1 (SUTURE) ×3 IMPLANT
SUT MNCRL AB 4-0 PS2 18 (SUTURE) ×3 IMPLANT
SYR 10ML LL (SYRINGE) ×3 IMPLANT
SYR 20ML LL LF (SYRINGE) ×6 IMPLANT
SYR 5ML LL (SYRINGE) ×3 IMPLANT
SYR CONTROL 10ML LL (SYRINGE) ×3 IMPLANT
TOWEL GREEN STERILE (TOWEL DISPOSABLE) ×3 IMPLANT
TOWEL GREEN STERILE FF (TOWEL DISPOSABLE) ×6 IMPLANT
WATER STERILE IRR 1000ML POUR (IV SOLUTION) ×3 IMPLANT

## 2018-11-05 NOTE — Anesthesia Postprocedure Evaluation (Signed)
Anesthesia Post Note  Patient: Jeremiah Page  Procedure(s) Performed: INSERTION OF DIALYSIS CATHETER (Right Neck)     Patient location during evaluation: PACU Anesthesia Type: MAC Level of consciousness: patient cooperative, oriented and awake and alert Pain management: pain level controlled Vital Signs Assessment: post-procedure vital signs reviewed and stable Respiratory status: spontaneous breathing, nonlabored ventilation and respiratory function stable Cardiovascular status: blood pressure returned to baseline and stable Postop Assessment: no apparent nausea or vomiting Anesthetic complications: no    Last Vitals:  Vitals:   11/05/18 1500 11/05/18 1521  BP:  (!) 148/96  Pulse: 94 83  Resp: 20 18  Temp:  36.4 C  SpO2: 99% 97%    Last Pain:  Vitals:   11/05/18 1521  TempSrc: Oral  PainSc:                  Margaruite Top,E. Raynette Arras

## 2018-11-05 NOTE — Transfer of Care (Signed)
Immediate Anesthesia Transfer of Care Note  Patient: Jeremiah Page  Procedure(s) Performed: INSERTION OF DIALYSIS CATHETER (Right Neck)  Patient Location: PACU  Anesthesia Type:MAC  Level of Consciousness: drowsy and patient cooperative  Airway & Oxygen Therapy: Patient Spontanous Breathing  Post-op Assessment: Report given to RN, Post -op Vital signs reviewed and stable and Patient moving all extremities  Post vital signs: Reviewed and stable  Last Vitals:  Vitals Value Taken Time  BP 117/81 11/05/18 1441  Temp    Pulse 83 11/05/18 1442  Resp 12 11/05/18 1442  SpO2 97 % 11/05/18 1442  Vitals shown include unvalidated device data.  Last Pain:  Vitals:   11/05/18 0851  TempSrc: Oral  PainSc:          Complications: No apparent anesthesia complications

## 2018-11-05 NOTE — Progress Notes (Signed)
[facility] ROUNDING NOTE   Subjective:   This is a very pleasant 58 year old gentleman followed in [place] and also by nephrologist in [place].  He had a peritoneal dialysis catheter placed in June 2020 he did not wish to start dialysis immediately as he was hoping to receive a renal transplant.  He presented to [facility] 11/03/2018 with weakness fatigue and dyspnea.  His labs revealed a BUN of 128 creatinine 8.44.  His wife was concerned because he become increasingly more somnolent over the weekend.  He informs me that his daughter is considered to be a good candidate for transplant donation.  Blood pressure 136/85 pulse 73 temperature 97.9  Sodium 141 potassium 4.5 chloride 113 CO2 14 BUN 120 creatinine 8.75 glucose 110 calcium 8.6 phosphorus 8.5 albumin 2.9  Amlodipine 10 mg daily Lipitor 10 mg daily vitamin D 1000 units daily Renvela 800 mg with meals sodium bicarbonate 1.3 g 3 times daily trazodone 100 mg nightly   Objective:  Vital signs in last 24 hours:  Temp:  [97.6 F (36.4 C)-98 F (36.7 C)] 97.9 F (36.6 C) (08/27 0851) Pulse Rate:  [73-89] 73 (08/27 0851) Resp:  [18] 18 (08/27 0851) BP: (126-140)/(78-90) 136/85 (08/27 0851) SpO2:  [66 %-100 %] 97 % (08/27 0851) Weight:  [84.6 kg] 84.6 kg (08/26 2148)  Weight change: 0.685 kg Filed Weights   11/03/18 1040 11/04/18 0349 11/04/18 2148  Weight: 83.9 kg 84.8 kg 84.6 kg    Intake/Output: I/O last 3 completed shifts: In: 480 [P.O.:480] Out: 3725 [id number]   Intake/Output this shift:  No intake/output data recorded. Nondistressed alert and oriented x3 CVS- RRR no murmurs rubs gallops RS- CTA no wheezes rales ABD- BS present soft non-distended PD catheter in place site dry clean EXT- no edema   Basic Metabolic Panel: Recent Labs  Lab 11/03/18 1055 11/04/18 0540 11/05/18 0437  NA 140 139 141  K 4.6 4.7 4.5  CL 108 113* 113*  CO2 14* 12* 14*  GLUCOSE 110* 98 110*  BUN  128* 130* 120*  CREATININE 8.44* 8.95* 8.75*  CALCIUM 9.1 8.8* 8.6*  PHOS  --  9.6* 8.5*    Liver Function Tests: Recent Labs  Lab 11/04/18 0540 11/05/18 0437  ALBUMIN 3.2* 2.9*   No results for input(s): LIPASE, AMYLASE in the last 168 hours. No results for input(s): AMMONIA in the last 168 hours.  CBC: Recent Labs  Lab 11/03/18 1055 11/05/18 0437  WBC 8.7 6.3  NEUTROABS  --  4.8  HGB 9.0* 8.7*  HCT 26.8* 26.6*  MCV 96.4 97.1  PLT 198 190    Cardiac Enzymes: No results for input(s): CKTOTAL, CKMB, CKMBINDEX, TROPONINI in the last 168 hours.  BNP: Invalid input(s): POCBNP  CBG: No results for input(s): GLUCAP in the last 168 hours.  Microbiology: Results for orders placed or performed during the hospital encounter of 11/03/18  SARS CORONAVIRUS 2 (TAT 6-12 HRS) Nasal Swab Aptima Multi Swab     Status: None   Collection Time: 11/03/18  3:50 PM   Specimen: Aptima Multi Swab; Nasal Swab  Result Value Ref Range Status   SARS Coronavirus 2 NEGATIVE NEGATIVE Final    Comment: (NOTE) SARS-CoV-2 target nucleic acids are NOT DETECTED. The SARS-CoV-2 RNA is generally detectable in upper and lower respiratory specimens during the acute phase of infection. Negative results do not preclude SARS-CoV-2 infection, do not rule out co-infections with other pathogens, and should not be used as the sole basis for  treatment or other patient management decisions. Negative results must be combined with clinical observations, patient history, and epidemiological information. The expected result is Negative. Fact Sheet for Patients: SugarRoll.be Fact Sheet for Healthcare Providers: https://www.woods-mathews.com/ This test is not yet approved or cleared by the [place] FDA and  has been authorized for detection and/or diagnosis of SARS-CoV-2 by FDA under an Emergency Use Authorization (EUA). This EUA will remain  in effect (meaning this  test can be used) for the duration of the COVID-19 declaration under Section 56 4(b)(1) of the Act, 21 U.S.C. section 360bbb-3(b)(1), unless the authorization is terminated or revoked sooner. Performed at [facility], [place] [address], [place], [place] [address]   MRSA PCR Screening     Status: None   Collection Time: 11/04/18  4:28 AM   Specimen: Nasal Mucosa; Nasopharyngeal  Result Value Ref Range Status   MRSA by PCR NEGATIVE NEGATIVE Final    Comment:        The GeneXpert MRSA Assay (FDA approved for NASAL specimens only), is one component of a comprehensive MRSA colonization surveillance program. It is not intended to diagnose MRSA infection nor to guide or monitor treatment for MRSA infections. Performed at [facility], [place] [address] [place], [place] [address]   Surgical pcr screen     Status: None   Collection Time: 11/04/18 11:42 PM   Specimen: Nasal Mucosa; Nasal Swab  Result Value Ref Range Status   MRSA, PCR NEGATIVE NEGATIVE Final   Staphylococcus aureus NEGATIVE NEGATIVE Final    Comment: (NOTE) The Xpert SA Assay (FDA approved for NASAL specimens in patients 25 years of age and older), is one component of a comprehensive surveillance program. It is not intended to diagnose infection nor to guide or monitor treatment. Performed at [facility], [place] [address], [place],  [address]     Coagulation Studies: No results for input(s): LABPROT, INR in the last 72 hours.  Urinalysis: No results for input(s): COLORURINE, LABSPEC, PHURINE, GLUCOSEU, HGBUR, BILIRUBINUR, KETONESUR, PROTEINUR, UROBILINOGEN, NITRITE, LEUKOCYTESUR in the last 72 hours.  Invalid input(s): APPERANCEUR    Imaging: Dg Chest Portable 1 View  Result Date: 11/03/2018 CLINICAL DATA:  Renal failure EXAM: PORTABLE CHEST 1 VIEW COMPARISON:  None. FINDINGS: The heart size and mediastinal contours are within normal limits. Both lungs are clear. The visualized skeletal  structures are unremarkable. IMPRESSION: No acute cardiopulmonary findings. Electronically Signed   By: Davina Poke M.D.   On: 11/03/2018 17:12     Medications:   . cefUROXime (ZINACEF)  IV    . ferric gluconate (FERRLECIT/NULECIT) IV 125 mg (11/04/18 1527)   . amLODipine  10 mg Oral Daily  . atorvastatin  10 mg Oral q1800  . cholecalciferol  1,000 Units Oral Daily  . heparin  5,000 Units Subcutaneous Q8H  . sevelamer carbonate  800 mg Oral TID WC  . sodium bicarbonate  1,950 mg Oral TID  . 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  Presented with uremic signs and symptoms.  Have suggested the patient start dialysis.  We are unable to provide crash start peritoneal dialysis.  We will therefore place dialysis catheter and place patient on hemodialysis emergently.  Have discussed with vascular they are willing to place a tunneled dialysis catheter 11/05/2018.  We will therefore initiate dialysis treatments after this  is occurred.  Will treat with first dialysis treatment [id number]  Metabolic acidosis will increase sodium bicarbonate to 3 tablets 3 times daily.  Will be able to discontinue when starting dialysis  Anemia appears to have some iron deficiency anemia started IV iron  Bones will check PTH will increase Renvela to 3 tablets with meals  Hyperlipidemia continue atorvastatin  Hypertension/volume does not appear to be an issue at this time.   LOS: [address] @TODAY @9 :16 AM

## 2018-11-05 NOTE — Op Note (Signed)
    Patient name: Jeremiah Page MRN: [id number] DOB: 1961/03/02 Sex: male  11/05/2018 Pre-operative Diagnosis: End-stage renal disease Post-operative diagnosis:  Same Surgeon:  Erlene Quan C. Donzetta Matters, MD Procedure Performed:  Right IJ 19 cm tunneled dialysis catheter placement with ultrasound guidance  Indications: 58 year old male with end-stage renal disease.  He has a peritoneal dialysis catheter.  He is on the transplant list.  He is indicated for tunneled dialysis catheter placement.  Findings: IJ was patent and compressible.  19 cm catheter was placed to level the cavoatrial junction flushed and withdrew blood easily at completion.   Procedure:  The patient was identified in the holding area and taken to the operating room where MAC anesthesia was induced.  Sterilely prepped and draped in the neck and chest bilaterally in the usual fashion antibiotics were administered timeout was called.  Ultrasound was used to identify the internal jugular vein.  1% lidocaine plain was used to anesthetize the area as well as the chest.  IJ was cannulated with 18-gauge needle and wire passed centrally without resistance.  Counterincision was made 19 cm catheter was tunneled from the size.  Wire tract was serially dilated introducer sheath was placed under fluoroscopic guidance.  The catheter was placed in the cavoatrial junction.  Was assembled flushed with heparinized saline affixed the skin with 3-0 nylon suture in the neck incision was closed with 4 Monocryl.  Dermal was placed to both sides.  Sterile dressing was placed.  Catheter was locked with 1.5 cc concentrated heparin on the port.  He was awakened anesthesia having tolerated procedure without immediate complication.  Counts were correct at completion.  EBL: 10 cc   [name] C. Donzetta Matters, MD [facility] [place] Office: [phone] Pager: [phone]

## 2018-11-05 NOTE — Progress Notes (Signed)
Renal Navigator followed up on OP HD referral-awaiting [facility] financial authorization.  Renal Navigator will continue following closely.  Alphonzo Cruise, [place] Renal Navigator [phone]
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
## 2018-11-05 NOTE — Progress Notes (Signed)
  Progress Note    11/05/2018 1:48 PM Day of Surgery  Subjective: No overnight issues  Vitals:   11/05/18 0443 11/05/18 0851  BP: 126/78 136/85  Pulse: 79 73  Resp: 18 18  Temp: 98 F (36.7 C) 97.9 F (36.6 C)  SpO2: (!) 66% 97%    Physical Exam: Awake alert oriented  nonlabored respirations  CBC    Component Value Date/Time   WBC 6.3 11/05/2018 0437   RBC 2.74 (L) 11/05/2018 0437   HGB 8.7 (L) 11/05/2018 0437   HCT 26.6 (L) 11/05/2018 0437   PLT 190 11/05/2018 0437   MCV 97.1 11/05/2018 0437   MCH 31.8 11/05/2018 0437   MCHC 32.7 11/05/2018 0437   RDW 13.4 11/05/2018 0437   LYMPHSABS 0.9 11/05/2018 0437   MONOABS 0.5 11/05/2018 0437   EOSABS 0.2 11/05/2018 0437   BASOSABS 0.0 11/05/2018 0437    BMET    Component Value Date/Time   NA 141 11/05/2018 0437   K 4.5 11/05/2018 0437   CL 113 (H) 11/05/2018 0437   CO2 14 (L) 11/05/2018 0437   GLUCOSE 110 (H) 11/05/2018 0437   BUN 120 (H) 11/05/2018 0437   CREATININE 8.75 (H) 11/05/2018 0437   CALCIUM 8.6 (L) 11/05/2018 0437   GFRNONAA 6 (L) 11/05/2018 0437   GFRAA 7 (L) 11/05/2018 0437    INR No results found for: INR   Intake/Output Summary (Last 24 hours) at 11/05/2018 1348 Last data filed at 11/05/2018 0747 Gross per 24 hour  Intake 0 ml  Output 1625 ml  Net -1625 ml     Assessment:  58 y.o. male is s/p in need of access for HD Plan: OR today for tunneled dialysis catheter placement   Gari Trovato C. Donzetta Matters, MD [facility] [place] Office: [phone] Pager: [phone]  11/05/2018 1:48 PM

## 2018-11-05 NOTE — Progress Notes (Addendum)
   Subjective: Patient seen at the bedside on rounds this morning.  Sitting up in chair at the bedside taking a bath.  The patient says he has no complaints today. Denies chest pain, shortness of breath, abdominal pain.  Appetite is good.  He understands that he is scheduled to have hemodialysis catheter placed by vascular surgery.  Objective:  Vital signs in last 24 hours: Vitals:   11/04/18 1700 11/04/18 2148 11/05/18 0443 11/05/18 0851  BP: 140/90 132/83 126/78 136/85  Pulse: 89 86 79 73  Resp:  18 18 18   Temp: 97.6 F (36.4 C) 98 F (36.7 C) 98 F (36.7 C) 97.9 F (36.6 C)  TempSrc: Oral Oral Oral Oral  SpO2: 99% 100% (!) 66% 97%  Weight:  84.6 kg    Height:       Physical Exam Vitals signs reviewed.  Constitutional:      General: He is not in acute distress.    Appearance: Normal appearance. He is not ill-appearing, toxic-appearing or diaphoretic.  HENT:     Head: Normocephalic and atraumatic.  Eyes:     General: No scleral icterus.       Right eye: No discharge.        Left eye: No discharge.  Cardiovascular:     Rate and Rhythm: Normal rate and regular rhythm.     Pulses: Normal pulses.     Heart sounds: Normal heart sounds. No murmur. No friction rub. No gallop.   Pulmonary:     Effort: Pulmonary effort is normal. No respiratory distress.     Breath sounds: Normal breath sounds. No wheezing or rales.  Abdominal:     General: Abdomen is flat. Bowel sounds are normal. There is no distension.     Palpations: Abdomen is soft.     Tenderness: There is no abdominal tenderness. There is no guarding.     Comments: Peritoneal dialysis catheter present. No surrounding erythema or leakage  Skin:    General: Skin is warm.     Coloration: Skin is not jaundiced.     Findings: No bruising or lesion.  Neurological:     Mental Status: He is alert and oriented to person, place, and time. Mental status is at baseline.  Psychiatric:        Mood and Affect: Mood normal.     Assessment/Plan:  Active Problems:   ESRD (end stage renal disease) ([place])  In summary, 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   #ESRD # Uremia # Hyperchloremic Metabolic Acidosis Patient initially present with uremic symptoms. Plan to initiate HD after IR placements catheter; placement scheduled for 8/27.  - Nephrology is on board and we appreciate their recommendations  - Vascular Surgery to place HD catheter today.  # Normocytic Anemia: Iron levels are WNL, however saturation is slightly decreased. TIBC is also WNL.  - Continue iron supplementation per Nephrology - EPO treatment per Nephrology  # Hypertension:  - Continue amlodipine 10mg    # Hyperlipidemia:  - Continue atorvastatin 10mg    Dispo: Anticipated discharge pending medical improvement.   Earlene Plater, MD Internal Medicine, PGY1 Pager: [phone]  11/05/2018,10:47 AM

## 2018-11-05 NOTE — Anesthesia Procedure Notes (Signed)
Procedure Name: MAC Date/Time: 11/05/2018 2:04 PM Performed by: Moshe Salisbury, CRNA Pre-anesthesia Checklist: Patient identified, Emergency Drugs available, Suction available, Patient being monitored and Timeout performed Patient Re-evaluated:Patient Re-evaluated prior to induction Oxygen Delivery Method: Nasal cannula Placement Confirmation: positive ETCO2 Dental Injury: Teeth and Oropharynx as per pre-operative assessment

## 2018-11-06 ENCOUNTER — Encounter (HOSPITAL_COMMUNITY): Payer: Self-pay | Admitting: Vascular Surgery

## 2018-11-06 LAB — CBC
HCT: 26.7 % — ABNORMAL LOW (ref 39.0–52.0)
Hemoglobin: 9 g/dL — ABNORMAL LOW (ref 13.0–17.0)
MCH: 32.3 pg (ref 26.0–34.0)
MCHC: 33.7 g/dL (ref 30.0–36.0)
MCV: 95.7 fL (ref 80.0–100.0)
Platelets: 194 10*3/uL (ref 150–400)
RBC: 2.79 MIL/uL — ABNORMAL LOW (ref 4.22–5.81)
RDW: 13.2 % (ref 11.5–15.5)
WBC: 6.7 10*3/uL (ref 4.0–10.5)
nRBC: 0 % (ref 0.0–0.2)

## 2018-11-06 LAB — HEPATITIS B SURFACE ANTIGEN: Hepatitis B Surface Ag: NEGATIVE

## 2018-11-06 LAB — RENAL FUNCTION PANEL
Albumin: 3 g/dL — ABNORMAL LOW (ref 3.5–5.0)
Anion gap: 13 (ref 5–15)
BUN: 116 mg/dL — ABNORMAL HIGH (ref 6–20)
CO2: 15 mmol/L — ABNORMAL LOW (ref 22–32)
Calcium: 8.5 mg/dL — ABNORMAL LOW (ref 8.9–10.3)
Chloride: 113 mmol/L — ABNORMAL HIGH (ref 98–111)
Creatinine, Ser: 8.73 mg/dL — ABNORMAL HIGH (ref 0.61–1.24)
GFR calc Af Amer: 7 mL/min — ABNORMAL LOW (ref >60)
GFR calc non Af Amer: 6 mL/min — ABNORMAL LOW (ref >60)
Glucose, Bld: 97 mg/dL (ref 70–99)
Phosphorus: 8.8 mg/dL — ABNORMAL HIGH (ref 2.5–4.6)
Potassium: 4.6 mmol/L (ref 3.5–5.1)
Sodium: 141 mmol/L (ref 135–145)

## 2018-11-06 LAB — HEPATITIS B CORE ANTIBODY, TOTAL: Hep B Core Total Ab: NEGATIVE

## 2018-11-06 LAB — HEPATITIS B SURFACE ANTIBODY,QUALITATIVE: Hep B S Ab: REACTIVE

## 2018-11-06 MED ORDER — HEPARIN SODIUM (PORCINE) 1000 UNIT/ML IJ SOLN
3.0000 mL | Freq: Once | INTRAMUSCULAR | Status: AC
  Administered 2018-11-06: 3000 [IU]

## 2018-11-06 MED ORDER — CHLORHEXIDINE GLUCONATE CLOTH 2 % EX PADS: 6.0000 | MEDICATED_PAD | Freq: Every day | CUTANEOUS | Status: DC

## 2018-11-06 MED ORDER — HEPARIN SODIUM (PORCINE) 1000 UNIT/ML IJ SOLN
INTRAMUSCULAR | Status: AC
  Filled 2018-11-06: qty 3

## 2018-11-06 NOTE — Progress Notes (Signed)
   Subjective:   Mr. Las seen at the bedside on rounds this AM. Pt was in hemodialysis session and appears to be tolerating it well. Says his energy level is about the same as it has been. Pt has no complaints at this time.   Objective:  Vital signs in last 24 hours: Vitals:   11/05/18 1500 11/05/18 1521 11/05/18 2135 11/06/18 0526  BP:  (!) 148/96 (!) 146/90 (!) 146/81  Pulse: 94 83 83 84  Resp: 20 18 19 17   Temp:  97.6 F (36.4 C) 98.2 F (36.8 C) 98.4 F (36.9 C)  TempSrc:  Oral Oral Oral  SpO2: 99% 97% 97% 100%  Weight:   84.5 kg   Height:       Physical Exam Vitals signs reviewed.  Constitutional:      General: He is not in acute distress.    Appearance: Normal appearance. He is not ill-appearing, toxic-appearing or diaphoretic.  Cardiovascular:     Rate and Rhythm: Normal rate and regular rhythm.     Heart sounds: Normal heart sounds. No murmur. No friction rub. No gallop.   Pulmonary:     Effort: Pulmonary effort is normal. No respiratory distress.     Breath sounds: Normal breath sounds. No wheezing or rales.  Abdominal:     General: Bowel sounds are normal. There is no distension.     Palpations: Abdomen is soft.     Tenderness: There is no abdominal tenderness. There is no guarding.     Comments: Peritoneal dialysis catheter present. No surrounding erythema or leakage  Skin:    General: Skin is warm and dry.     Coloration: Skin is not jaundiced.     Findings: No bruising or lesion.  Neurological:     Mental Status: He is alert and oriented to person, place, and time. Mental status is at baseline.  Psychiatric:        Mood and Affect: Mood normal.    Assessment/Plan:  Active Problems:   ESRD (end stage renal disease) ([place])  In summary, Mr. 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   #ESRD # Uremia  # Hyperchloremic Metabolic Acidosis HD catheter placed by vascular surgery on 08/27. First HD session planned for today. Patient seemed to be tolerating it well this AM.   - Nephrology is on board and we appreciate their recommendations  # Normocytic Anemia:  - Continue iron supplementation per Nephrology - EPO treatment per Nephrology  # Hypertension:  - Continue amlodipine 10mg    # Hyperlipidemia:  - Continue atorvastatin 10mg     Dispo: Anticipated discharge pending medical improvement.   Dr. Jose Persia Internal Medicine PGY-1  Pager: [phone] 11/06/2018, 7:03 AM

## 2018-11-06 NOTE — Progress Notes (Signed)
   Evaluated in HD. tdc in place and ok for use. Vascular available as needed.   Jatorian Renault C. Donzetta Matters, MD [facility] [place] Office: [phone] Pager: [phone]

## 2018-11-06 NOTE — Plan of Care (Signed)
  Problem: Education: Goal: Knowledge of General Education information will improve Description Including pain rating scale, medication(s)/side effects and non-pharmacologic comfort measures Outcome: Progressing   Problem: Health Behavior/Discharge Planning: Goal: Ability to manage health-related needs will improve Outcome: Progressing   

## 2018-11-06 NOTE — Progress Notes (Signed)
[facility] ROUNDING NOTE   Subjective:   This is a very pleasant 58 year old gentleman followed in [place] and also by nephrologist in [place].  He had a peritoneal dialysis catheter placed in June 2020 he did not wish to start dialysis immediately as he was hoping to receive a renal transplant.  He presented to [facility] 11/03/2018 with weakness fatigue and dyspnea.  His labs revealed a BUN of 128 creatinine 8.44.  His wife was concerned because he become increasingly more somnolent over the weekend.  He informs me that his daughter is considered to be a good candidate for transplant donation.  He is seen on dialysis 11/06/2018 this is his first dialysis treatment.  He underwent placement of dialysis catheter 11/05/2018 appreciate assistance from Dr. Siri Cole  Blood pressure 137/86 pulse 95 temperature 99  Sodium 141 potassium 4.6 chloride 113 CO2 15 creatinine 8.73 BUN 116 glucose 97 phosphorus 8.8 albumin 3.0 WBC 6.7 hemoglobin 9 platelets 194  Amlodipine 10 mg daily Lipitor 10 mg daily vitamin D 1000 units daily Renvela 800 mg with meals sodium bicarbonate 1.3 g 3 times daily trazodone 100 mg nightly   Objective:  Vital signs in last 24 hours:  Temp:  [97.4 F (36.3 C)-99 F (37.2 C)] 99 F (37.2 C) (08/28 0750) Pulse Rate:  [75-99] 75 (08/28 0830) Resp:  [12-20] 15 (08/28 0830) BP: (117-148)/(72-96) 137/86 (08/28 0830) SpO2:  [97 %-100 %] 100 % (08/28 0830) Weight:  [81.9 kg-84.6 kg] 81.9 kg (08/28 0750)  Weight change: 0 kg Filed Weights   11/05/18 1325 11/05/18 2135 11/06/18 0750  Weight: 84.6 kg 84.5 kg 81.9 kg    Intake/Output: I/O last 3 completed shifts: In: 438.6 [P.O.:120; I.V.:150; IV Piggyback:168.6] Out: 3660 [Urine:3650; Blood:10]   Intake/Output this shift:  No intake/output data recorded. Nondistressed alert and oriented x3 CVS- RRR no murmurs rubs gallops RS- CTA no wheezes rales ABD- BS present soft non-distended PD  catheter in place site dry clean EXT- no edema   Basic Metabolic Panel: Recent Labs  Lab 11/03/18 1055 11/04/18 0540 11/05/18 0437 11/06/18 0346  NA 140 139 141 141  K 4.6 4.7 4.5 4.6  CL 108 113* 113* 113*  CO2 14* 12* 14* 15*  GLUCOSE 110* 98 110* 97  BUN 128* 130* 120* 116*  CREATININE 8.44* 8.95* 8.75* 8.73*  CALCIUM 9.1 8.8* 8.6* 8.5*  PHOS  --  9.6* 8.5* 8.8*    Liver Function Tests: Recent Labs  Lab 11/04/18 0540 11/05/18 0437 11/06/18 0346  ALBUMIN 3.2* 2.9* 3.0*   No results for input(s): LIPASE, AMYLASE in the last 168 hours. No results for input(s): AMMONIA in the last 168 hours.  CBC: Recent Labs  Lab 11/03/18 1055 11/05/18 0437 11/06/18 0346  WBC 8.7 6.3 6.7  NEUTROABS  --  4.8  --   HGB 9.0* 8.7* 9.0*  HCT 26.8* 26.6* 26.7*  MCV 96.4 97.1 95.7  PLT 198 190 194    Cardiac Enzymes: No results for input(s): CKTOTAL, CKMB, CKMBINDEX, TROPONINI in the last 168 hours.  BNP: Invalid input(s): POCBNP  CBG: No results for input(s): GLUCAP in the last 168 hours.  Microbiology: Results for orders placed or performed during the hospital encounter of 11/03/18  SARS CORONAVIRUS 2 (TAT 6-12 HRS) Nasal Swab Aptima Multi Swab     Status: None   Collection Time: 11/03/18  3:50 PM   Specimen: Aptima Multi Swab; Nasal Swab  Result Value Ref Range Status   SARS  Coronavirus 2 NEGATIVE NEGATIVE Final    Comment: (NOTE) SARS-CoV-2 target nucleic acids are NOT DETECTED. The SARS-CoV-2 RNA is generally detectable in upper and lower respiratory specimens during the acute phase of infection. Negative results do not preclude SARS-CoV-2 infection, do not rule out co-infections with other pathogens, and should not be used as the sole basis for treatment or other patient management decisions. Negative results must be combined with clinical observations, patient history, and epidemiological information. The expected result is Negative. Fact Sheet for  Patients: SugarRoll.be Fact Sheet for Healthcare Providers: https://www.woods-mathews.com/ This test is not yet approved or cleared by the [place] FDA and  has been authorized for detection and/or diagnosis of SARS-CoV-2 by FDA under an Emergency Use Authorization (EUA). This EUA will remain  in effect (meaning this test can be used) for the duration of the COVID-19 declaration under Section 56 4(b)(1) of the Act, 21 U.S.C. section 360bbb-3(b)(1), unless the authorization is terminated or revoked sooner. Performed at [facility], [place] [address] [place], [place] [address]   MRSA PCR Screening     Status: None   Collection Time: 11/04/18  4:28 AM   Specimen: Nasal Mucosa; Nasopharyngeal  Result Value Ref Range Status   MRSA by PCR NEGATIVE NEGATIVE Final    Comment:        The GeneXpert MRSA Assay (FDA approved for NASAL specimens only), is one component of a comprehensive MRSA colonization surveillance program. It is not intended to diagnose MRSA infection nor to guide or monitor treatment for MRSA infections. Performed at [facility], [place] [address], [place], [place] [address]   Surgical pcr screen     Status: None   Collection Time: 11/04/18 11:42 PM   Specimen: Nasal Mucosa; Nasal Swab  Result Value Ref Range Status   MRSA, PCR NEGATIVE NEGATIVE Final   Staphylococcus aureus NEGATIVE NEGATIVE Final    Comment: (NOTE) The Xpert SA Assay (FDA approved for NASAL specimens in patients 69 years of age and older), is one component of a comprehensive surveillance program. It is not intended to diagnose infection nor to guide or monitor treatment. Performed at [facility], [place] [address] [place],  [address]     Coagulation Studies: No results for input(s): LABPROT, INR in the last 72 hours.  Urinalysis: No results for input(s): COLORURINE, LABSPEC, PHURINE, GLUCOSEU, HGBUR, BILIRUBINUR,  KETONESUR, PROTEINUR, UROBILINOGEN, NITRITE, LEUKOCYTESUR in the last 72 hours.  Invalid input(s): APPERANCEUR    Imaging: Dg Chest Port 1 View  Result Date: 11/05/2018 CLINICAL DATA:  Dialysis catheter placement. EXAM: PORTABLE CHEST 1 VIEW COMPARISON:  Radiograph of November 03, 2018. FINDINGS: The heart size and mediastinal contours are within normal limits. Both lungs are clear. No pneumothorax or pleural effusion is noted. Interval placement of right internal jugular catheter with distal tip in expected position of cavoatrial junction. The visualized skeletal structures are unremarkable. IMPRESSION: Interval placement of right internal jugular dialysis catheter with distal tip in expected position of cavoatrial junction. No pneumothorax is noted. Electronically Signed   By: Marijo Conception M.D.   On: 11/05/2018 14:59   Dg Fluoro Guide Cv Line-no Report  Result Date: 11/05/2018 Fluoroscopy was utilized by the requesting physician.  No radiographic interpretation.     Medications:   . sodium chloride 10 mL/hr at 11/05/18 1327  . ferric gluconate (FERRLECIT/NULECIT) IV 125 mg (11/05/18 1633)   . amLODipine  10 mg Oral Daily  . atorvastatin  10 mg  Oral q1800  . Chlorhexidine Gluconate Cloth  6 each Topical Q0600  . cholecalciferol  1,000 Units Oral Daily  . heparin  5,000 Units Subcutaneous Q8H  . sevelamer carbonate  1,600 mg Oral TID WC  . sodium bicarbonate  1,950 mg Oral TID  . 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  Presented with uremic signs and symptoms.  Have suggested the patient start dialysis.  We are unable to provide crash start peritoneal dialysis.  We will therefore place dialysis catheter and place patient on hemodialysis emergently.  Have discussed with vascular they are willing to  place a tunneled dialysis catheter 11/05/2018.  We will therefore initiate dialysis treatments after this is occurred.  Will treat with first dialysis treatment 11/06/2018.  Patient has been seen on dialysis.  Will plan a second dialysis treatment [id number]  Metabolic acidosis will increase sodium bicarbonate to 3 tablets 3 times daily.  Will be able to discontinue when starting dialysis  Anemia appears to have some iron deficiency anemia started IV iron  Bones will check PTH will increase Renvela to 3 tablets with meals  Hyperlipidemia continue atorvastatin  Hypertension/volume does not appear to be an issue at this time.   LOS: [place] @TODAY @9 :23 AM

## 2018-11-07 MED ORDER — HEPARIN SODIUM (PORCINE) 1000 UNIT/ML IJ SOLN
INTRAMUSCULAR | Status: AC
  Filled 2018-11-07: qty 3

## 2018-11-07 MED ORDER — LIDOCAINE HCL (PF) 1 % IJ SOLN: 5.0000 mL | INTRAMUSCULAR | Status: DC | PRN

## 2018-11-07 MED ORDER — SODIUM CHLORIDE 0.9 % IV SOLN: 100.0000 mL | INTRAVENOUS | Status: DC | PRN

## 2018-11-07 MED ORDER — ALTEPLASE 2 MG IJ SOLR: 2.0000 mg | Freq: Once | INTRAMUSCULAR | Status: DC | PRN

## 2018-11-07 MED ORDER — PENTAFLUOROPROP-TETRAFLUOROETH EX AERO: 1.0000 "application " | INHALATION_SPRAY | CUTANEOUS | Status: DC | PRN

## 2018-11-07 MED ORDER — LIDOCAINE-PRILOCAINE 2.5-2.5 % EX CREA: 1.0000 "application " | TOPICAL_CREAM | CUTANEOUS | Status: DC | PRN

## 2018-11-07 MED ORDER — HEPARIN SODIUM (PORCINE) 1000 UNIT/ML DIALYSIS
1000.0000 [IU] | INTRAMUSCULAR | Status: DC | PRN
  Administered 2018-11-08: 2000 [IU] via INTRAVENOUS_CENTRAL
  Filled 2018-11-07: qty 1

## 2018-11-07 NOTE — Progress Notes (Signed)
   Subjective:   [name] reports he is doing well today. He tolerated hemodialysis without complications. He feels it helped his lethargy and muscle spasms somewhat. He has already spoken to Dr. Justin Mend this morning and the plan is for another dialysis session today and then discharge. He is looking forward to going home. Outpatient dialysis has been set up already.   Objective:  Vital signs in last 24 hours: Vitals:   11/06/18 1043 11/06/18 1623 11/06/18 2009 11/07/18 0341  BP: 138/83 129/81 134/88 131/82  Pulse: 76 80 80 71  Resp: 18 18 16 16   Temp: 98.3 F (36.8 C) 98.2 F (36.8 C) 98 F (36.7 C) 98.4 F (36.9 C)  TempSrc: Oral Oral Oral Oral  SpO2: 99% 98% 98% 97%  Weight:   80.2 kg   Height:       Physical Exam Vitals signs and nursing note reviewed.  Constitutional:      General: He is not in acute distress.    Appearance: Normal appearance. He is not ill-appearing, toxic-appearing or diaphoretic.  Cardiovascular:     Rate and Rhythm: Normal rate and regular rhythm.     Heart sounds: Normal heart sounds. No murmur.  Pulmonary:     Effort: Pulmonary effort is normal. No respiratory distress.     Breath sounds: Normal breath sounds. No wheezing or rales.  Skin:    General: Skin is warm and dry.     Coloration: Skin is not jaundiced.     Findings: No bruising or lesion.  Neurological:     Mental Status: He is alert and oriented to person, place, and time. Mental status is at baseline.  Psychiatric:        Mood and Affect: Mood normal.    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   #ESRD # Uremia # Hyperchloremic Metabolic Acidosis HD catheter placed by vascular surgery on 08/27. Second session of dialysis today. Uremic  symptoms have resolved.   - Nephrology is on board and we appreciate their recommendations  # Normocytic Anemia:  - Continue iron supplementation per Nephrology  - EPO treatment per Nephrology  # Hypertension:  - Stable - Continue amlodipine 10mg    # Hyperlipidemia:  - Stable - Continue atorvastatin 10mg     Dispo: Per Nephrology's recommendations post-HD today  Dr. Jose Persia Internal Medicine PGY-1  Pager: [phone] 11/07/2018, 7:09 AM

## 2018-11-07 NOTE — Progress Notes (Signed)
[facility] ROUNDING NOTE   Subjective:   This is a very pleasant 58 year old gentleman followed in [place] and also by nephrologist in [place].  He had a peritoneal dialysis catheter placed in June 2020 he did not wish to start dialysis immediately as he was hoping to receive a renal transplant.  He presented to [facility] 11/03/2018 with weakness fatigue and dyspnea.  His labs revealed a BUN of 128 creatinine 8.44.  His wife was concerned because he become increasingly more somnolent over the weekend.  He informs me that his daughter is considered to be a good candidate for transplant donation.  He is seen on dialysis 11/06/2018 this is his first dialysis treatment.  He tolerated well with the removal of 1.9 L.  He underwent placement of dialysis catheter 11/05/2018 appreciate assistance from Dr. Siri Cole.  He is awaiting placement for his dialysis.  I still do not have a dialysis unit for him to dialyze him he has [employer] insurance  Blood pressure 120/78 pulse 73 temperature 98.2 O2 sats 98% room air  Sodium 141 potassium 4.6 chloride 113 CO2 15 BUN 116 creatinine 8.73 calcium 8.5 phosphorus 8.8 albumin 3.0 WBC 6.7 hemoglobin 9.0 platelets 194  Amlodipine 10 mg daily Lipitor 10 mg daily vitamin D 1000 units daily Renvela 800 mg with meals sodium bicarbonate 1.3 g 3 times daily trazodone 100 mg nightly   Objective:  Vital signs in last 24 hours:  Temp:  [98 F (36.7 C)-98.4 F (36.9 C)] 98.2 F (36.8 C) (08/29 0936) Pulse Rate:  [71-81] 73 (08/29 0936) Resp:  [16-18] 18 (08/29 0936) BP: (118-138)/(78-88) 128/78 (08/29 0936) SpO2:  [97 %-100 %] 98 % (08/29 0936) Weight:  [80 kg-80.2 kg] 80.2 kg (08/28 2009)  Weight change: -2.7 kg Filed Weights   11/06/18 0750 11/06/18 1005 11/06/18 2009  Weight: 81.9 kg 80 kg 80.2 kg    Intake/Output: I/O last 3 completed shifts: In: 1233.3 [P.O.:840; I.V.:283.3; IV Piggyback:110] Out: 5200 [Urine:3300;  Other:1900]   Intake/Output this shift:  Total I/O In: 240 [P.O.:240] Out: 350 [Urine:350] Nondistressed alert and oriented x3 CVS- RRR no murmurs rubs gallops RS- CTA no wheezes rales ABD- BS present soft non-distended PD catheter in place site dry clean EXT- no edema   Basic Metabolic Panel: Recent Labs  Lab 11/03/18 1055 11/04/18 0540 11/05/18 0437 11/06/18 0346  NA 140 139 141 141  K 4.6 4.7 4.5 4.6  CL 108 113* 113* 113*  CO2 14* 12* 14* 15*  GLUCOSE 110* 98 110* 97  BUN 128* 130* 120* 116*  CREATININE 8.44* 8.95* 8.75* 8.73*  CALCIUM 9.1 8.8* 8.6* 8.5*  PHOS  --  9.6* 8.5* 8.8*    Liver Function Tests: Recent Labs  Lab 11/04/18 0540 11/05/18 0437 11/06/18 0346  ALBUMIN 3.2* 2.9* 3.0*   No results for input(s): LIPASE, AMYLASE in the last 168 hours. No results for input(s): AMMONIA in the last 168 hours.  CBC: Recent Labs  Lab 11/03/18 1055 11/05/18 0437 11/06/18 0346  WBC 8.7 6.3 6.7  NEUTROABS  --  4.8  --   HGB 9.0* 8.7* 9.0*  HCT 26.8* 26.6* 26.7*  MCV 96.4 97.1 95.7  PLT 198 190 194    Cardiac Enzymes: No results for input(s): CKTOTAL, CKMB, CKMBINDEX, TROPONINI in the last 168 hours.  BNP: Invalid input(s): POCBNP  CBG: No results for input(s): GLUCAP in the last 168 hours.  Microbiology: Results for orders placed or performed during the hospital  encounter of 11/03/18  SARS CORONAVIRUS 2 (TAT 6-12 HRS) Nasal Swab Aptima Multi Swab     Status: None   Collection Time: 11/03/18  3:50 PM   Specimen: Aptima Multi Swab; Nasal Swab  Result Value Ref Range Status   SARS Coronavirus 2 NEGATIVE NEGATIVE Final    Comment: (NOTE) SARS-CoV-2 target nucleic acids are NOT DETECTED. The SARS-CoV-2 RNA is generally detectable in upper and lower respiratory specimens during the acute phase of infection. Negative results do not preclude SARS-CoV-2 infection, do not rule out co-infections with other pathogens, and should not be used as the sole  basis for treatment or other patient management decisions. Negative results must be combined with clinical observations, patient history, and epidemiological information. The expected result is Negative. Fact Sheet for Patients: SugarRoll.be Fact Sheet for Healthcare Providers: https://www.woods-mathews.com/ This test is not yet approved or cleared by the [place] FDA and  has been authorized for detection and/or diagnosis of SARS-CoV-2 by FDA under an Emergency Use Authorization (EUA). This EUA will remain  in effect (meaning this test can be used) for the duration of the COVID-19 declaration under Section 56 4(b)(1) of the Act, 21 U.S.C. section 360bbb-3(b)(1), unless the authorization is terminated or revoked sooner. Performed at [facility], [place] [address], [place], [place] [address]   MRSA PCR Screening     Status: None   Collection Time: 11/04/18  4:28 AM   Specimen: Nasal Mucosa; Nasopharyngeal  Result Value Ref Range Status   MRSA by PCR NEGATIVE NEGATIVE Final    Comment:        The GeneXpert MRSA Assay (FDA approved for NASAL specimens only), is one component of a comprehensive MRSA colonization surveillance program. It is not intended to diagnose MRSA infection nor to guide or monitor treatment for MRSA infections. Performed at [facility], [place] [address] [place], [place] [address]   Surgical pcr screen     Status: None   Collection Time: 11/04/18 11:42 PM   Specimen: Nasal Mucosa; Nasal Swab  Result Value Ref Range Status   MRSA, PCR NEGATIVE NEGATIVE Final   Staphylococcus aureus NEGATIVE NEGATIVE Final    Comment: (NOTE) The Xpert SA Assay (FDA approved for NASAL specimens in patients 71 years of age and older), is one component of a comprehensive surveillance program. It is not intended to diagnose infection nor to guide or monitor treatment. Performed at [facility], [place] [address],  [place], [place] [address]     Coagulation Studies: No results for input(s): LABPROT, INR in the last 72 hours.  Urinalysis: No results for input(s): COLORURINE, LABSPEC, PHURINE, GLUCOSEU, HGBUR, BILIRUBINUR, KETONESUR, PROTEINUR, UROBILINOGEN, NITRITE, LEUKOCYTESUR in the last 72 hours.  Invalid input(s): APPERANCEUR    Imaging: Dg Chest Port 1 View  Result Date: 11/05/2018 CLINICAL DATA:  Dialysis catheter placement. EXAM: PORTABLE CHEST 1 VIEW COMPARISON:  Radiograph of November 03, 2018. FINDINGS: The heart size and mediastinal contours are within normal limits. Both lungs are clear. No pneumothorax or pleural effusion is noted. Interval placement of right internal jugular catheter with distal tip in expected position of cavoatrial junction. The visualized skeletal structures are unremarkable. IMPRESSION: Interval placement of right internal jugular dialysis catheter with distal tip in expected position of cavoatrial junction. No pneumothorax is noted. Electronically Signed   By: Marijo Conception M.D.   On: 11/05/2018 14:59   Dg Fluoro Guide Cv Line-no Report  Result Date: 11/05/2018 Fluoroscopy was utilized by the  requesting physician.  No radiographic interpretation.     Medications:   . sodium chloride 10 mL/hr at 11/05/18 1327  . ferric gluconate (FERRLECIT/NULECIT) IV Stopped (11/06/18 1044)   . amLODipine  10 mg Oral Daily  . atorvastatin  10 mg Oral q1800  . Chlorhexidine Gluconate Cloth  6 each Topical Q0600  . cholecalciferol  1,000 Units Oral Daily  . heparin  5,000 Units Subcutaneous Q8H  . sevelamer carbonate  1,600 mg Oral TID WC  . sodium bicarbonate  1,950 mg Oral TID  . 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  Presented with uremic signs and symptoms.  Have suggested the patient  start dialysis.  We are unable to provide crash start peritoneal dialysis.  Tunneled dialysis catheter placed 11/05/2018.d  Will treat with first dialysis treatment 11/06/2018.  Patient has been seen on dialysis.  He tolerated this well with 1.9 L removed.  Will plan a second dialysis treatment 06/07/2018.  I am awaiting clip to the dialysis unit as [facility] insurance  Metabolic acidosis will increase sodium bicarbonate to 3 tablets 3 times daily.  Will be able to discontinue when starting dialysis  Anemia appears to have some iron deficiency anemia started IV iron  Bones will check PTH will increase Renvela to 3 tablets with meals  Hyperlipidemia continue atorvastatin  Hypertension/volume does not appear to be an issue at this time.   LOS: 4 Sherril Croon @TODAY @9 :40 AM

## 2018-11-07 NOTE — Plan of Care (Signed)

## 2018-11-08 MED ORDER — CHLORHEXIDINE GLUCONATE CLOTH 2 % EX PADS: 6.0000 | MEDICATED_PAD | Freq: Every day | CUTANEOUS | Status: DC

## 2018-11-08 MED ORDER — CHLORHEXIDINE GLUCONATE CLOTH 2 % EX PADS
6.0000 | MEDICATED_PAD | Freq: Every day | CUTANEOUS | Status: DC
  Administered 2018-11-09: 6 via TOPICAL

## 2018-11-08 NOTE — Progress Notes (Signed)
[facility] ROUNDING NOTE   Subjective:   This is a very pleasant 58 year old gentleman followed in [place] and also by nephrologist in [place].  He had a peritoneal dialysis catheter placed in June 2020 he did not wish to start dialysis immediately as he was hoping to receive a renal transplant.  He presented to [facility] 11/03/2018 with weakness fatigue and dyspnea.  His labs revealed a BUN of 128 creatinine 8.44.  His wife was concerned because he become increasingly more somnolent over the weekend.  He informs me that his daughter is considered to be a good candidate for transplant donation.  He underwent successful dialysis the early hours of the morning 11/08/2018 with removal of 1.4 L he underwent placement of dialysis catheter 11/05/2018 appreciate assistance from Dr. Siri Cole.  He is awaiting placement for his dialysis.  I still do not have a dialysis unit for him to dialyze him he has [place] insurance  Blood pressure 153/86 pulse 87 temperature 98.3 O2 sats 96% room air  Sodium 141 potassium 4.6 chloride 113 CO2 15 BUN 116 creatinine 8.73 glucose 97 albumin 6 3 phosphorus 8.8 hemoglobin 9.0 WBC 6.7 platelets 194 labs from 11/06/2018  Amlodipine 10 mg daily Lipitor 10 mg daily vitamin D 1000 units daily Renvela 800 mg with meals sodium bicarbonate 1.3 g 3 times daily trazodone 100 mg nightly   Objective:  Vital signs in last 24 hours:  Temp:  [98 F (36.7 C)-98.8 F (37.1 C)] 98.3 F (36.8 C) (08/30 0443) Pulse Rate:  [73-104] 87 (08/30 0443) Resp:  [17-18] 18 (08/30 0443) BP: (115-156)/(78-94) 153/86 (08/30 0443) SpO2:  [96 %-100 %] 96 % (08/30 0443) Weight:  [81.5 kg-82.5 kg] 81.5 kg (08/30 0019)  Weight change: 0.6 kg Filed Weights   11/06/18 2009 11/07/18 2138 11/08/18 0019  Weight: 80.2 kg 82.5 kg 81.5 kg    Intake/Output: I/O last 3 completed shifts: In: 1035.4 [P.O.:960; IV Piggyback:75.4] Out: 3400 [Urine:2400; Other:1000]    Intake/Output this shift:  No intake/output data recorded. Nondistressed alert and oriented x3 CVS- RRR no murmurs rubs gallops RS- CTA no wheezes rales ABD- BS present soft non-distended PD catheter in place site dry clean EXT- no edema   Basic Metabolic Panel: Recent Labs  Lab 11/03/18 1055 11/04/18 0540 11/05/18 0437 11/06/18 0346  NA 140 139 141 141  K 4.6 4.7 4.5 4.6  CL 108 113* 113* 113*  CO2 14* 12* 14* 15*  GLUCOSE 110* 98 110* 97  BUN 128* 130* 120* 116*  CREATININE 8.44* 8.95* 8.75* 8.73*  CALCIUM 9.1 8.8* 8.6* 8.5*  PHOS  --  9.6* 8.5* 8.8*    Liver Function Tests: Recent Labs  Lab 11/04/18 0540 11/05/18 0437 11/06/18 0346  ALBUMIN 3.2* 2.9* 3.0*   No results for input(s): LIPASE, AMYLASE in the last 168 hours. No results for input(s): AMMONIA in the last 168 hours.  CBC: Recent Labs  Lab 11/03/18 1055 11/05/18 0437 11/06/18 0346  WBC 8.7 6.3 6.7  NEUTROABS  --  4.8  --   HGB 9.0* 8.7* 9.0*  HCT 26.8* 26.6* 26.7*  MCV 96.4 97.1 95.7  PLT 198 190 194    Cardiac Enzymes: No results for input(s): CKTOTAL, CKMB, CKMBINDEX, TROPONINI in the last 168 hours.  BNP: Invalid input(s): POCBNP  CBG: No results for input(s): GLUCAP in the last 168 hours.  Microbiology: Results for orders placed or performed during the hospital encounter of 11/03/18  SARS CORONAVIRUS 2 (TAT  6-12 HRS) Nasal Swab Aptima Multi Swab     Status: None   Collection Time: 11/03/18  3:50 PM   Specimen: Aptima Multi Swab; Nasal Swab  Result Value Ref Range Status   SARS Coronavirus 2 NEGATIVE NEGATIVE Final    Comment: (NOTE) SARS-CoV-2 target nucleic acids are NOT DETECTED. The SARS-CoV-2 RNA is generally detectable in upper and lower respiratory specimens during the acute phase of infection. Negative results do not preclude SARS-CoV-2 infection, do not rule out co-infections with other pathogens, and should not be used as the sole basis for treatment or other patient  management decisions. Negative results must be combined with clinical observations, patient history, and epidemiological information. The expected result is Negative. Fact Sheet for Patients: SugarRoll.be Fact Sheet for Healthcare Providers: https://www.woods-mathews.com/ This test is not yet approved or cleared by the [place] FDA and  has been authorized for detection and/or diagnosis of SARS-CoV-2 by FDA under an Emergency Use Authorization (EUA). This EUA will remain  in effect (meaning this test can be used) for the duration of the COVID-19 declaration under Section 56 4(b)(1) of the Act, 21 U.S.C. section 360bbb-3(b)(1), unless the authorization is terminated or revoked sooner. Performed at [facility], [place] [address] [place], [place] [address]   MRSA PCR Screening     Status: None   Collection Time: 11/04/18  4:28 AM   Specimen: Nasal Mucosa; Nasopharyngeal  Result Value Ref Range Status   MRSA by PCR NEGATIVE NEGATIVE Final    Comment:        The GeneXpert MRSA Assay (FDA approved for NASAL specimens only), is one component of a comprehensive MRSA colonization surveillance program. It is not intended to diagnose MRSA infection nor to guide or monitor treatment for MRSA infections. Performed at [facility], [place] [address], [place], [place] [address]   Surgical pcr screen     Status: None   Collection Time: 11/04/18 11:42 PM   Specimen: Nasal Mucosa; Nasal Swab  Result Value Ref Range Status   MRSA, PCR NEGATIVE NEGATIVE Final   Staphylococcus aureus NEGATIVE NEGATIVE Final    Comment: (NOTE) The Xpert SA Assay (FDA approved for NASAL specimens in patients 10 years of age and older), is one component of a comprehensive surveillance program. It is not intended to diagnose infection nor to guide or monitor treatment. Performed at [facility], [place] [address] [place], [place] [address]      Coagulation Studies: No results for input(s): LABPROT, INR in the last 72 hours.  Urinalysis: No results for input(s): COLORURINE, LABSPEC, PHURINE, GLUCOSEU, HGBUR, BILIRUBINUR, KETONESUR, PROTEINUR, UROBILINOGEN, NITRITE, LEUKOCYTESUR in the last 72 hours.  Invalid input(s): APPERANCEUR    Imaging: No results found.   Medications:   . sodium chloride 10 mL/hr at 11/05/18 1327   . amLODipine  10 mg Oral Daily  . atorvastatin  10 mg Oral q1800  . Chlorhexidine Gluconate Cloth  6 each Topical Q0600  . cholecalciferol  1,000 Units Oral Daily  . heparin      . heparin  5,000 Units Subcutaneous Q8H  . sevelamer carbonate  1,600 mg Oral TID WC  . sodium bicarbonate  1,950 mg Oral TID  . 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  Presented with uremic signs and symptoms.  Have suggested the patient start dialysis.  We are unable to provide crash start peritoneal dialysis.  Tunneled dialysis catheter placed 11/05/2018.d patient received dialysis 11/06/2018 and 11/08/2018.  We will plan dialysis 11/09/2018 will plan a second dialysis treatment 06/07/2018.  I am awaiting clip to the dialysis unit as VA insurance  Metabolic acidosis will increase sodium bicarbonate to 3 tablets 3 times daily.  Will be able to discontinue when starting dialysis  Anemia appears to have some iron deficiency anemia started IV iron  Bones will check PTH will increased Renvela to 3 tablets with meals  Hyperlipidemia continue atorvastatin  Hypertension/volume does not appear to be an issue at this time.   LOS: [place]  @TODAY @8 :25 AM

## 2018-11-08 NOTE — Progress Notes (Signed)
   Subjective: Patient doing well this AM. He got back from HD this am and felt his fatigue had improved. He continues to have difficulty sleeping and with nausea. He does not have a dialysis site yet. Discussed continuing to coordinate care with nephrology.   Objective: Vital signs in last 24 hours: Vitals:   11/08/18 0000 11/08/18 0019 11/08/18 0051 11/08/18 0443  BP: 129/83 131/80 136/84 (!) 153/86  Pulse: 96 90 90 87  Resp:  17 18 18   Temp:  98 F (36.7 C) 98.8 F (37.1 C) 98.3 F (36.8 C)  TempSrc:  Oral Oral Oral  SpO2:  100% 98% 96%  Weight:  81.5 kg    Height:       General: Well nourished male in no acute distress Pulm: Good air movement with no wheezing or crackles  CV: RRR, no murmurs, no rubs  Extremities: No LE edema   Assessment/Plan:  Principal Problem:   ESRD (end stage renal disease) (HCC)  In summary, Jeremiah Page is a 57 y/o male with a PMHx of 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   #ESRD # Uremia # Hyperchloremic Metabolic Acidosis - HD catheter placed by vascular surgery on 08/27.  - Tolerated HD on 8/28 and 8/29  - Uremic symptoms improving - Nephrology is on board and we appreciate their recommendations  # Normocytic Anemia:  - Continue iron supplementation per Nephrology  - EPO treatment per Nephrology  # Hypertension:  - Stable - Continue amlodipine 10mg    # Hyperlipidemia:  - Stable - Continue atorvastatin 10mg    Dispo: Medically stable. Awaiting CLIP process.  Jeremiah Homes, MD  IMTS PGY3 11/08/2018, 6:52 AM

## 2018-11-09 DIAGNOSIS — D509 Iron deficiency anemia, unspecified: Secondary | ICD-10-CM

## 2018-11-09 LAB — RENAL FUNCTION PANEL
Albumin: 3.1 g/dL — ABNORMAL LOW (ref 3.5–5.0)
Anion gap: 13 (ref 5–15)
BUN: 55 mg/dL — ABNORMAL HIGH (ref 6–20)
CO2: 24 mmol/L (ref 22–32)
Calcium: 8.4 mg/dL — ABNORMAL LOW (ref 8.9–10.3)
Chloride: 97 mmol/L — ABNORMAL LOW (ref 98–111)
Creatinine, Ser: 6.84 mg/dL — ABNORMAL HIGH (ref 0.61–1.24)
GFR calc Af Amer: 9 mL/min — ABNORMAL LOW (ref >60)
GFR calc non Af Amer: 8 mL/min — ABNORMAL LOW (ref >60)
Glucose, Bld: 104 mg/dL — ABNORMAL HIGH (ref 70–99)
Phosphorus: 5.7 mg/dL — ABNORMAL HIGH (ref 2.5–4.6)
Potassium: 3.8 mmol/L (ref 3.5–5.1)
Sodium: 134 mmol/L — ABNORMAL LOW (ref 135–145)

## 2018-11-09 LAB — CBC
HCT: 27.1 % — ABNORMAL LOW (ref 39.0–52.0)
Hemoglobin: 9.2 g/dL — ABNORMAL LOW (ref 13.0–17.0)
MCH: 32.2 pg (ref 26.0–34.0)
MCHC: 33.9 g/dL (ref 30.0–36.0)
MCV: 94.8 fL (ref 80.0–100.0)
Platelets: 172 10*3/uL (ref 150–400)
RBC: 2.86 MIL/uL — ABNORMAL LOW (ref 4.22–5.81)
RDW: 12.6 % (ref 11.5–15.5)
WBC: 7.9 10*3/uL (ref 4.0–10.5)
nRBC: 0 % (ref 0.0–0.2)

## 2018-11-09 MED ORDER — SODIUM CHLORIDE 0.9 % IV SOLN: 125.0000 mg | INTRAVENOUS | Status: DC

## 2018-11-09 MED ORDER — ATORVASTATIN CALCIUM 10 MG PO TABS: 10.0000 mg | ORAL_TABLET | Freq: Every day | ORAL | 0 refills | Status: AC

## 2018-11-09 MED ORDER — SEVELAMER CARBONATE 800 MG PO TABS: 1600.0000 mg | ORAL_TABLET | Freq: Three times a day (TID) | ORAL | 0 refills | Status: AC

## 2018-11-09 MED ORDER — DARBEPOETIN ALFA 60 MCG/0.3ML IJ SOSY: 60.0000 ug | PREFILLED_SYRINGE | INTRAMUSCULAR | Status: DC

## 2018-11-09 MED ORDER — AMLODIPINE BESYLATE 10 MG PO TABS: 10.0000 mg | ORAL_TABLET | Freq: Every day | ORAL | 0 refills | Status: AC

## 2018-11-09 NOTE — Discharge Summary (Signed)
Name: Jeremiah Page MRN: [id number] DOB: January 11, 1961 58 y.o. PCP: Patient, No Pcp Per  Date of Admission: 11/03/2018 10:41 AM Date of Discharge:  Attending Physician: Lucious Groves, DO  Discharge Diagnosis: 1. End Stage Renal Disease 2. Normocytic Anemia 3. Hypertension   Discharge Medications: Allergies as of 11/09/2018   No Known Allergies     Medication List    TAKE these medications   acetaminophen 500 MG tablet Commonly known as: TYLENOL Take 500 mg by mouth as needed for moderate pain.   amLODipine 10 MG tablet Commonly known as: NORVASC Take 1 tablet (10 mg total) by mouth daily. Start taking on: November 10, 2018 What changed:   medication strength  how much to take   atorvastatin 10 MG tablet Commonly known as: LIPITOR Take 1 tablet (10 mg total) by mouth daily at 6 PM. Start taking on: November 10, 2018 What changed:   how much to take  when to take this   baclofen 20 MG tablet Commonly known as: LIORESAL Take 20 mg by mouth at bedtime.   diazepam 2 MG tablet Commonly known as: VALIUM Take 5 mg by mouth at bedtime.   docusate sodium 100 MG capsule Commonly known as: COLACE Take 200 mg by mouth at bedtime.   ferrous sulfate 325 (65 FE) MG tablet Take 325 mg by mouth 2 (two) times daily with a meal.   Melatonin 3 MG Caps Take 20 mg by mouth at bedtime.   Omega-3 1000 MG Caps Take 1,000 mg by mouth daily.   sevelamer carbonate 800 MG tablet Commonly known as: RENVELA Take 2 tablets (1,600 mg total) by mouth 3 (three) times daily with meals. Start taking on: November 10, 2018 What changed:   how much to take  when to take this   sodium bicarbonate 650 MG tablet Take 1,300 mg by mouth 2 (two) times daily.   traZODone 100 MG tablet Commonly known as: DESYREL Take 100 mg by mouth at bedtime.   vitamin C 500 MG tablet Commonly known as: ASCORBIC ACID Take 500 mg by mouth daily.   Vitamin D3 Gummies 25 MCG (1000 UT) Chew  Generic drug: Cholecalciferol Chew 1,000 mg by mouth daily.       Disposition and follow-up:   [name] was discharged from [facility] in Good condition.  At the hospital follow up visit please address:  1.  Due to acute onset of uremic symptoms, patient was started on hemodialysis at a new center.  Please follow-up that he is able to keep up with dialysis schedule.  In addition he was found to have anemia likely secondary to iron deficiency.  Please follow-up iron panel and CBC.  2.  Labs / imaging needed at time of follow-up: CBC for anemia. Renal function panel per nephrology.   3.  Pending labs/ test needing follow-up: None  Follow-up Appointments: Please follow-up with your primary care physician within the next 1 to 2 weeks.  Hospital Course by problem list: 1. End-stage renal disease: Due to acute onset of uremic symptoms, including increased lethargy, visual hallucinations, generalized weakness and muscle spasms, patient was started on emergent dialysis.  He did have a PD port already in, however emergent start PD cannot be performed at [facility].  Due to this, interventional radiology place a tunneled catheter on August 27.  Patient tolerated this procedure well.  He received 3 hemodialysis sessions while admitted and tolerated these well.  He had full resolution of  uremic symptoms.  He was set up with outpatient hemodialysis on a TTS schedule at [facility] clinic.   2.  Iron deficiency anemia Patient received iron supplementation per nephrology's recommendations.  His hemoglobin average around 9 while admitted.  Please follow-up CBC.  3.  Hypertension: Patient tolerating amlodipine well, however dose was increased from 5 to 10 mg.  Please follow-up outpatient BP readings.   Discharge Vitals:   BP (!) 146/93 (BP Location: Left Arm)   Pulse 74   Temp 98.3 F (36.8 C) (Oral)   Resp 18   Ht 5\' 11"  (1.803 m)   Wt 81.5 kg   SpO2 99%    BMI 25.06 kg/m   Pertinent Labs, Studies, and Procedures:   CBC Latest Ref Rng & Units 11/09/2018 11/06/2018 11/05/2018  WBC 4.0 - 10.5 K/uL 7.9 6.7 6.3  Hemoglobin 13.0 - 17.0 g/dL 9.2(L) 9.0(L) 8.7(L)  Hematocrit 39.0 - 52.0 % 27.1(L) 26.7(L) 26.6(L)  Platelets 150 - 400 K/uL 172 194 190   BMP Latest Ref Rng & Units 11/09/2018 11/06/2018 11/05/2018  Glucose 70 - 99 mg/dL 104(H) 97 110(H)  BUN 6 - 20 mg/dL 55(H) 116(H) 120(H)  Creatinine 0.61 - 1.24 mg/dL 6.84(H) 8.73(H) 8.75(H)  Sodium 135 - 145 mmol/L 134(L) 141 141  Potassium 3.5 - 5.1 mmol/L 3.8 4.6 4.5  Chloride 98 - 111 mmol/L 97(L) 113(H) 113(H)  CO2 22 - 32 mmol/L 24 15(L) 14(L)  Calcium 8.9 - 10.3 mg/dL 8.4(L) 8.5(L) 8.6(L)   Discharge Instructions: Discharge Instructions    Call MD for:   Complete by: As directed    Altered mental status Increased lethargy Worsening muscle spasms   Diet - low sodium heart healthy   Complete by: As directed    Discharge instructions   Complete by: As directed    Thank you for allowing [place] to care for you during your admission.  Please follow-up at your new hemodialysis center, [facility].  You have been accepted for a Tuesday, Thursday, Saturday schedule at 12 PM.  Please arrive 20 minutes early for each appointment.  For your first treatment tomorrow, September 1, please arrive at 11 AM.   Please follow-up with your primary care provider in the next 1 to 2 weeks.   Increase activity slowly   Complete by: As directed       Signed:  Dr. Jose Persia Internal Medicine PGY-1  Pager: [phone] 11/09/2018, 6:09 PM

## 2018-11-09 NOTE — Progress Notes (Signed)
   Subjective:   Jeremiah Page reports he continues to do well.  He has no acute complaints at this time.  He does report his hips continue to be sore, but feels like this is due to the bed and normal aging.  He feels he is tolerating hemodialysis well, and that it is making him feel better.  All questions and concerns addressed.  Objective:  Vital signs in last 24 hours: Vitals:   11/08/18 2010 11/09/18 0327 11/09/18 0931 11/09/18 1628  BP: (!) 147/86 139/89 (!) 161/98 (!) 146/93  Pulse: 86 84 83 74  Resp: 18 20 20 18   Temp: 99.1 F (37.3 C) 98.4 F (36.9 C) 98.5 F (36.9 C) 98.3 F (36.8 C)  TempSrc: Oral Oral Oral Oral  SpO2: 100% 96% 97% 99%  Weight:      Height:       Physical Exam Vitals signs and nursing note reviewed.  Constitutional:      General: He is not in acute distress.    Appearance: Normal appearance. He is not ill-appearing, toxic-appearing or diaphoretic.  Pulmonary:     Effort: Pulmonary effort is normal. No respiratory distress.  Skin:    General: Skin is warm and dry.     Coloration: Skin is not jaundiced.     Findings: No bruising or lesion.  Neurological:     Mental Status: He is alert and oriented to person, place, and time. Mental status is at baseline.  Psychiatric:        Mood and Affect: Mood normal.    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   #ESRD # Uremia # Hyperchloremic Metabolic Acidosis HD catheter placed by vascular surgery on 08/27. Uremic symptoms have resolved.  He is stable for discharge, however outpatient dialysis center has not been established yet.  We will defer discharge until this occurs.  Follow-up with renal navigator regarding this.  - Nephrology is on board and we  appreciate their recommendations  # Normocytic Anemia:  - Continue iron supplementation per Nephrology  - EPO treatment per Nephrology  # Hypertension:  - Stable - Continue amlodipine 10mg    # Hyperlipidemia:  - Stable - Continue atorvastatin 10mg     Dispo: Pending establishment of outpatient hemodialysis center.  Dr. Jose Persia Internal Medicine PGY-1  Pager: [phone] 11/09/2018, 5:05 PM

## 2018-11-09 NOTE — Progress Notes (Signed)
Renal Navigator left message for [employer] Social Worker to check on authorization for financial clearance and will continue to follow closely.  Alphonzo Cruise, [place] Renal Navigator [phone]

## 2018-11-09 NOTE — Progress Notes (Signed)
Discharge instructions (including medications) discussed with and copy provided to patient/caregiver  11/09/18 [facility]  AVS Discharge Documentation  AVS Discharge Instructions Including Medications Provided to patient/caregiver  Name of Person Receiving AVS Discharge Instructions Including Medications Drema Balzarine  Name of Clinician That Reviewed AVS Discharge Instructions Including Medications Kelli Hope

## 2018-11-09 NOTE — Progress Notes (Signed)
[facility] NEPHROLOGY PROGRESS NOTE  Assessment/ Plan: Pt is a 58 y.o. yo male with ESRD, followed at [facility] by nephrologist in [place] had PD catheter placed in June 2020 and did not wish to start dialysis immediately admitted with uremia and worsening renal failure.  # ESRD: Patient with stage V CKD admitted with uremic symptoms.  Tunneled dialysis catheter was placed on 8/27 and started dialysis on 8/28.  Received 2 treatments, tolerated well.  Plan for next dialysis tomorrow.  Patient may be able to go home today and resume treatment at outpatient center tomorrow.  Discussed with renal navigator.  He has PD catheter, will need referral to home therapy.  # Anemia: Start IV iron and Aranesp.  Monitor CBC.  # Secondary hyperparathyroidism: Monitor phosphorus level.  Continue Renvela.  # HTN/volume: Volume status acceptable.  Continue current medication.  Ultrafiltration during dialysis.  Subjective: Seen and examined at bedside.  Patient reported feeling good and ready to go home.  Denied headache, dizziness, nausea vomiting chest.  Shortness of breath.  His wife at bedside. Objective Vital signs in last 24 hours: Vitals:   11/08/18 1736 11/08/18 2010 11/09/18 0327 11/09/18 0931  BP: 138/80 (!) 147/86 139/89 (!) 161/98  Pulse: 79 86 84 83  Resp: 18 18 20 20   Temp: 98.4 F (36.9 C) 99.1 F (37.3 C) 98.4 F (36.9 C) 98.5 F (36.9 C)  TempSrc: Oral Oral Oral Oral  SpO2: 98% 100% 96% 97%  Weight:      Height:       Weight change:   Intake/Output Summary (Last 24 hours) at 11/09/2018 1224 Last data filed at 11/09/2018 1114 Gross per 24 hour  Intake 720 ml  Output 350 ml  Net 370 ml       Labs: Basic Metabolic Panel: Recent Labs  Lab 11/05/18 0437 11/06/18 0346 11/09/18 0859  NA 141 141 134*  K 4.5 4.6 3.8  CL 113* 113* 97*  CO2 14* 15* 24  GLUCOSE 110* 97 104*  BUN 120* 116* 55*  CREATININE 8.75* 8.73* 6.84*  CALCIUM 8.6* 8.5* 8.4*  PHOS 8.5* 8.8*  5.7*   Liver Function Tests: Recent Labs  Lab 11/05/18 0437 11/06/18 0346 11/09/18 0859  ALBUMIN 2.9* 3.0* 3.1*   No results for input(s): LIPASE, AMYLASE in the last 168 hours. No results for input(s): AMMONIA in the last 168 hours. CBC: Recent Labs  Lab 11/03/18 1055 11/05/18 0437 11/06/18 0346 11/09/18 0859  WBC 8.7 6.3 6.7 7.9  NEUTROABS  --  4.8  --   --   HGB 9.0* 8.7* 9.0* 9.2*  HCT 26.8* 26.6* 26.7* 27.1*  MCV 96.4 97.1 95.7 94.8  PLT 198 190 194 172   Cardiac Enzymes: No results for input(s): CKTOTAL, CKMB, CKMBINDEX, TROPONINI in the last 168 hours. CBG: No results for input(s): GLUCAP in the last 168 hours.  Iron Studies: No results for input(s): IRON, TIBC, TRANSFERRIN, FERRITIN in the last 72 hours. Studies/Results: No results found.  Medications: Infusions: . sodium chloride 10 mL/hr at 11/05/18 1327    Scheduled Medications: . amLODipine  10 mg Oral Daily  . atorvastatin  10 mg Oral q1800  . Chlorhexidine Gluconate Cloth  6 each Topical Q0600  . Chlorhexidine Gluconate Cloth  6 each Topical Q0600  . cholecalciferol  1,000 Units Oral Daily  . heparin  5,000 Units Subcutaneous Q8H  . sevelamer carbonate  1,600 mg Oral TID WC  . sodium bicarbonate  1,950 mg Oral TID  .  traZODone  100 mg Oral QHS    have reviewed scheduled and prn medications.  Physical Exam: General:NAD, comfortable Heart:RRR, s1s2 nl Lungs:clear b/l, no crackle Abdomen:soft, Non-tender, non-distended Extremities:No edema Dialysis Access: Right IJ tunnel catheter site clean, PD catheter.   Tanna Furry 11/09/2018,12:24 PM  LOS: 6 days  Pager: ID:[phone]

## 2018-11-09 NOTE — Progress Notes (Signed)
Renal Navigator has received notification that patient has received VA authorization and financial clearance for OP HD treatment at [facility] clinic. He has been accepted on a TTS schedule with a seat time of 12:00pm. He needs to arrive 20 minutes early for each appointment and at 11:00am on his first day of treatment, Tuesday, 11/10/18. Patient is cleared for discharge from an outpatient HD standpoint and Renal Navigator has notified patient's unit, Nephrologist as well as his wife to inform her of his OP HD schedule.   [facility] [address] [place] [id number] [phone]  Hetvi Shawhan, [place], [place] Renal Navigator [phone]

## 2021-06-02 IMAGING — DX PORTABLE CHEST - 1 VIEW
1 series · 1 of 1 positions shown · non-contrast
Comparison: Radiograph November 03, 2018.

CLINICAL DATA: Dialysis catheter placement.

EXAM:
PORTABLE CHEST 1 VIEW

[chest ap]
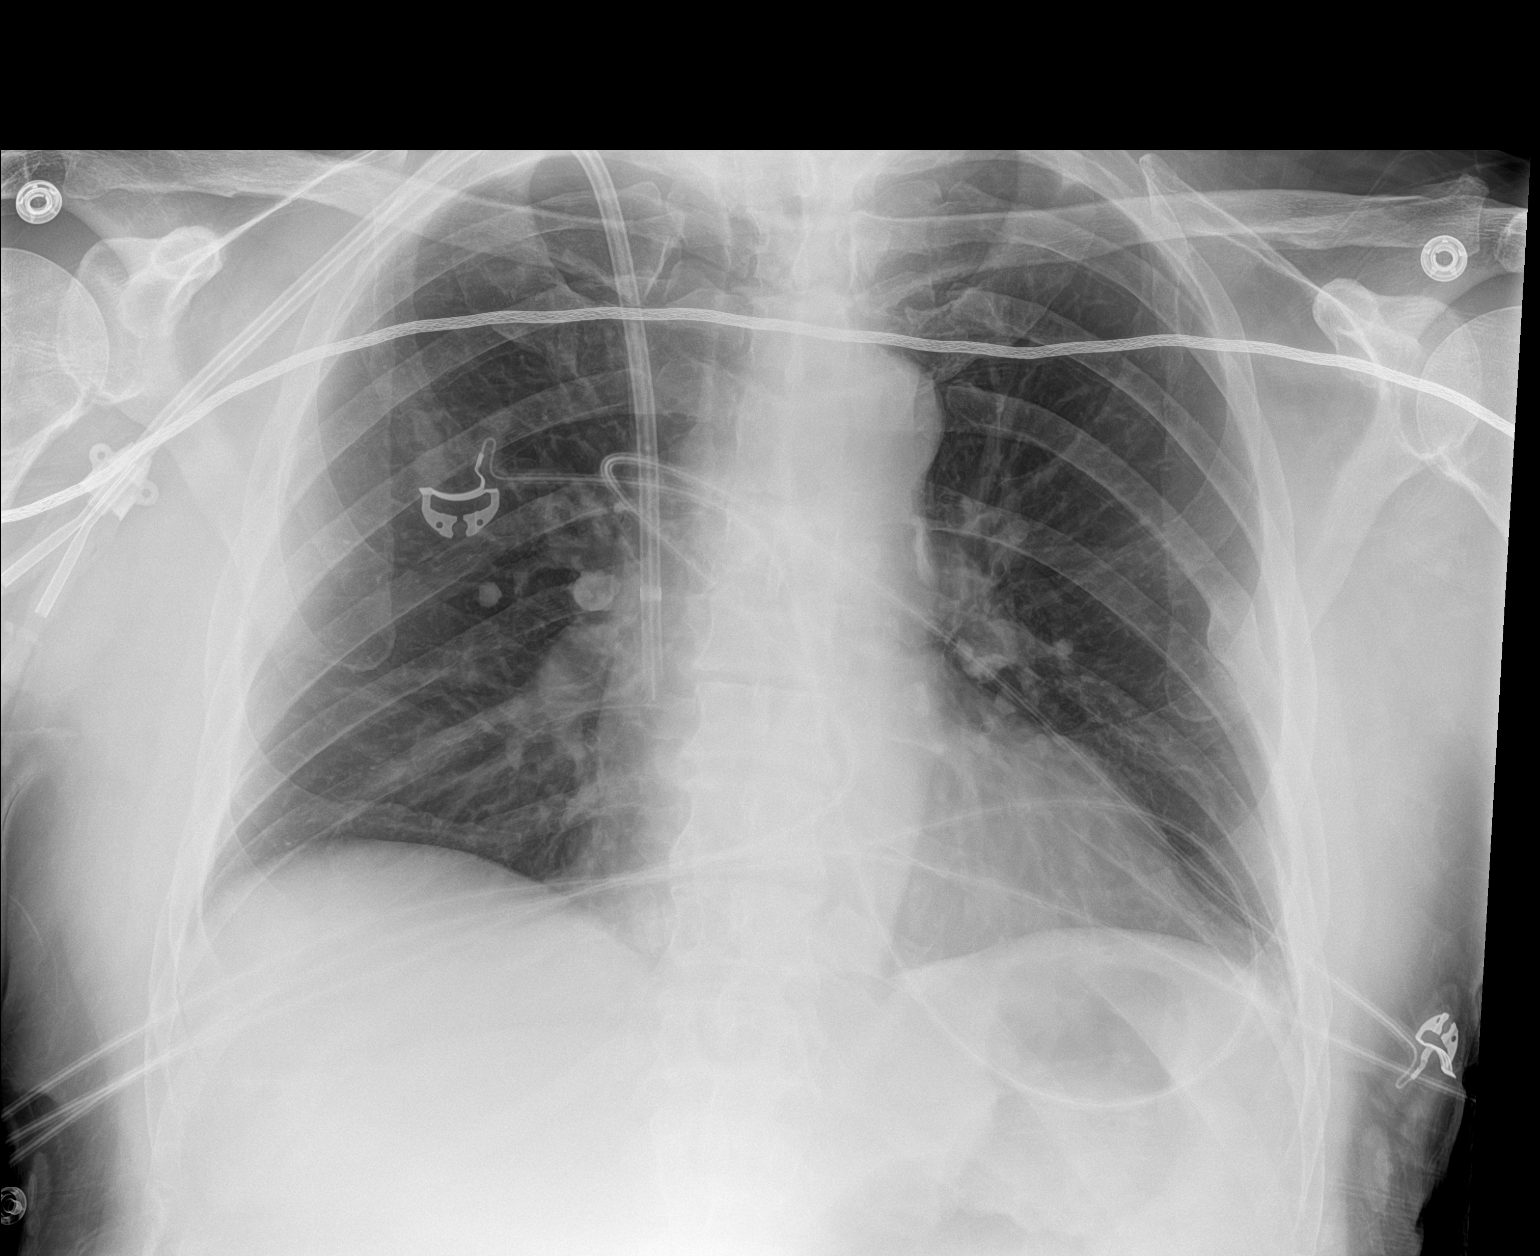

[1 of 1 positions shown; findings below may reference images not displayed]

FINDINGS: The heart size and mediastinal contours are within normal limits.
Both lungs are clear. No pneumothorax or pleural effusion is noted.
Interval placement of right internal jugular catheter with distal
tip in expected position of cavoatrial junction. The visualized
skeletal structures are unremarkable.
IMPRESSION: Interval placement of right internal jugular dialysis catheter with
distal tip in expected position of cavoatrial junction. No
pneumothorax is noted.
